# Patient Record
Sex: Female | Born: 1975
Health system: Southern US, Community
[De-identification: ages and names within clinical notes are randomized; demographics above are authoritative.]

## PROBLEM LIST (undated history)

## (undated) DIAGNOSIS — Z9889 Other specified postprocedural states: Secondary | ICD-10-CM

## (undated) DIAGNOSIS — Z8489 Family history of other specified conditions: Secondary | ICD-10-CM

## (undated) DIAGNOSIS — R112 Nausea with vomiting, unspecified: Secondary | ICD-10-CM

## (undated) DIAGNOSIS — F419 Anxiety disorder, unspecified: Secondary | ICD-10-CM

## (undated) HISTORY — DX: Anxiety disorder, unspecified: F41.9

## (undated) HISTORY — PX: TUBAL LIGATION: SHX77

---

## 1998-02-24 ENCOUNTER — Other Ambulatory Visit: Admission: RE | Admit: 1998-02-24 | Discharge: 1998-02-24 | Payer: Self-pay | Admitting: Family Medicine

## 1999-05-04 ENCOUNTER — Other Ambulatory Visit: Admission: RE | Admit: 1999-05-04 | Discharge: 1999-05-04 | Payer: Self-pay | Admitting: Gynecology

## 1999-10-23 ENCOUNTER — Inpatient Hospital Stay (HOSPITAL_COMMUNITY): Admission: AD | Admit: 1999-10-23 | Discharge: 1999-10-23 | Payer: Self-pay | Admitting: *Deleted

## 1999-11-17 ENCOUNTER — Inpatient Hospital Stay (HOSPITAL_COMMUNITY): Admission: AD | Admit: 1999-11-17 | Discharge: 1999-11-17 | Payer: Self-pay | Admitting: Gynecology

## 1999-11-22 ENCOUNTER — Inpatient Hospital Stay (HOSPITAL_COMMUNITY): Admission: AD | Admit: 1999-11-22 | Discharge: 1999-11-25 | Payer: Self-pay | Admitting: Gynecology

## 2000-01-04 ENCOUNTER — Other Ambulatory Visit: Admission: RE | Admit: 2000-01-04 | Discharge: 2000-01-04 | Payer: Self-pay | Admitting: Gynecology

## 2001-03-11 ENCOUNTER — Other Ambulatory Visit: Admission: RE | Admit: 2001-03-11 | Discharge: 2001-03-11 | Payer: Self-pay | Admitting: Obstetrics and Gynecology

## 2002-05-05 ENCOUNTER — Other Ambulatory Visit: Admission: RE | Admit: 2002-05-05 | Discharge: 2002-05-05 | Payer: Self-pay | Admitting: Obstetrics and Gynecology

## 2003-06-08 ENCOUNTER — Other Ambulatory Visit: Admission: RE | Admit: 2003-06-08 | Discharge: 2003-06-08 | Payer: Self-pay | Admitting: Obstetrics and Gynecology

## 2003-11-01 ENCOUNTER — Encounter: Admission: RE | Admit: 2003-11-01 | Discharge: 2003-11-01 | Payer: Self-pay | Admitting: Obstetrics and Gynecology

## 2003-12-16 ENCOUNTER — Inpatient Hospital Stay (HOSPITAL_COMMUNITY): Admission: RE | Admit: 2003-12-16 | Discharge: 2003-12-18 | Payer: Self-pay | Admitting: Obstetrics and Gynecology

## 2003-12-16 ENCOUNTER — Encounter (INDEPENDENT_AMBULATORY_CARE_PROVIDER_SITE_OTHER): Payer: Self-pay | Admitting: *Deleted

## 2004-03-23 ENCOUNTER — Emergency Department (HOSPITAL_COMMUNITY): Admission: EM | Admit: 2004-03-23 | Discharge: 2004-03-23 | Payer: Self-pay | Admitting: Family Medicine

## 2004-06-03 ENCOUNTER — Emergency Department (HOSPITAL_COMMUNITY): Admission: AD | Admit: 2004-06-03 | Discharge: 2004-06-03 | Payer: Self-pay | Admitting: Family Medicine

## 2004-08-15 ENCOUNTER — Other Ambulatory Visit: Admission: RE | Admit: 2004-08-15 | Discharge: 2004-08-15 | Payer: Self-pay | Admitting: Obstetrics and Gynecology

## 2005-07-17 ENCOUNTER — Encounter: Admission: RE | Admit: 2005-07-17 | Discharge: 2005-07-17 | Payer: Self-pay | Admitting: Obstetrics and Gynecology

## 2005-08-30 ENCOUNTER — Other Ambulatory Visit: Admission: RE | Admit: 2005-08-30 | Discharge: 2005-08-30 | Payer: Self-pay | Admitting: Obstetrics and Gynecology

## 2005-12-23 ENCOUNTER — Emergency Department (HOSPITAL_COMMUNITY): Admission: EM | Admit: 2005-12-23 | Discharge: 2005-12-23 | Payer: Self-pay | Admitting: Emergency Medicine

## 2008-06-11 ENCOUNTER — Emergency Department (HOSPITAL_COMMUNITY): Admission: EM | Admit: 2008-06-11 | Discharge: 2008-06-11 | Payer: Self-pay | Admitting: Family Medicine

## 2009-04-11 ENCOUNTER — Encounter (INDEPENDENT_AMBULATORY_CARE_PROVIDER_SITE_OTHER): Payer: Self-pay | Admitting: *Deleted

## 2009-07-21 DIAGNOSIS — G43909 Migraine, unspecified, not intractable, without status migrainosus: Secondary | ICD-10-CM | POA: Insufficient documentation

## 2009-07-25 ENCOUNTER — Ambulatory Visit: Payer: Self-pay | Admitting: Gastroenterology

## 2009-07-26 ENCOUNTER — Telehealth: Payer: Self-pay | Admitting: Gastroenterology

## 2009-07-30 ENCOUNTER — Emergency Department (HOSPITAL_COMMUNITY): Admission: EM | Admit: 2009-07-30 | Discharge: 2009-07-30 | Payer: Self-pay | Admitting: Emergency Medicine

## 2009-08-07 ENCOUNTER — Telehealth (INDEPENDENT_AMBULATORY_CARE_PROVIDER_SITE_OTHER): Payer: Self-pay | Admitting: *Deleted

## 2009-12-02 ENCOUNTER — Emergency Department (HOSPITAL_COMMUNITY): Admission: EM | Admit: 2009-12-02 | Discharge: 2009-12-02 | Payer: Self-pay | Admitting: Family Medicine

## 2010-03-20 NOTE — Letter (Signed)
Summary: New Patient letter  [facility]  [address] [place], [place] [address]   Phone: [phone]  Fax: [phone]       04/11/2009 MRN: [id number]  Catherine Adams [phone] [place], [place]  [address]  Dear Ms. Vilma Meckel,  Welcome to the Gastroenterology Division at [facility].    You are scheduled to see Dr. Arlyce Dice on 05-08-09 at 2:00p.m. on the 3rd floor at [facility], [address].  We ask that you try to arrive at our office 15 minutes prior to your appointment time to allow for check-in.  We would like you to complete the enclosed self-administered evaluation form prior to your visit and bring it with you on the day of your appointment.  We will review it with you.  Also, please bring a complete list of all your medications or, if you prefer, bring the medication bottles and we will list them.  Please bring your insurance card so that we may make a copy of it.  If your insurance requires a referral to see a specialist, please bring your referral form from your primary care physician.  Co-payments are due at the time of your visit and may be paid by cash, check or credit card.     Your office visit will consist of a consult with your physician (includes a physical exam), any laboratory testing he/she may order, scheduling of any necessary diagnostic testing (e.g. x-ray, ultrasound, CT-scan), and scheduling of a procedure (e.g. Endoscopy, Colonoscopy) if required.  Please allow enough time on your schedule to allow for any/all of these possibilities.    If you cannot keep your appointment, please call [phone] to cancel or reschedule prior to your appointment date.  This allows [place] the opportunity to schedule an appointment for another patient in need of care.  If you do not cancel or reschedule by 5 p.m. the business day prior to your appointment date, you will be charged a $50.00 late cancellation/no-show fee.    Thank you for  choosing [facility] for your medical needs.  We appreciate the opportunity to care for you.  Please visit [place] at our website  to learn more about our practice.                     Sincerely,                                                             The Gastroenterology Division

## 2010-03-20 NOTE — Progress Notes (Signed)
Summary: TRIAGE  Phone Note Call from Patient Call back at Work Phone [phone]   Caller: Patient Call For: Dr. Arlyce Dice Reason for Call: Talk to Nurse Summary of Call: wants to know if she is about to be or already is discharged from the practice because of her NOS history Initial call taken by: Vallarie Mare,  July 26, 2009 4:53 PM  Follow-up for Phone Call        Pt. was a 'No Show' 05-08-09, 06-19-09 and 07-25-09.  [facility] PLEASE ADVISE  Follow-up by: Laureen Ochs LPN,  July 26, 2128 4:57 PM  Additional Follow-up for Phone Call Additional follow up Details #1::        I will no longer see this pt Additional Follow-up by: Louis Meckel MD,  July 27, 2009 2:20 PM    Additional Follow-up for Phone Call Additional follow up Details #2::    Pt. advised we will no longer schedule her at [facility].  Per Clarnce Flock,  I asked Ursula Beath to document this in our computer so pt. won't get rescheduled.  Follow-up by: Laureen Ochs LPN,  July 28, 8655 3:01 PM

## 2010-03-20 NOTE — Progress Notes (Signed)
  Phone Note Call from Patient   Caller: Patient Details for Reason: Patient is looking for records that she states should have been sent to Dr. Arlyce Dice for review from [facility]. Details of Action Taken: I explained the process for when records are received in Medical Records from an outside facility. I also called and spoke with Laureen Ochs and Zella Ball of which neither have seen records from [facility] on this patient. Summary of Call: I informed the patient that we had not received records on her from [place]. She stated she would call [facility] to verify if records were sent to Dr. Arlyce Dice or not. Initial call taken by: Wilder Glade,  August 07, 2009 1:49 PM

## 2010-07-06 NOTE — Op Note (Signed)
Catherine Adams, Catherine Adams               ACCOUNT NO.:  [id number]   MEDICAL RECORD NO.:  [id number]          PATIENT TYPE:  INP   LOCATION:  9146                          [facility]   PHYSICIAN:  Leighton Roach Meisinger, M.D.DATE OF BIRTH:  [birth year]   DATE OF PROCEDURE:  12/16/2003  DATE OF DISCHARGE:                                 OPERATIVE REPORT   PREOPERATIVE DIAGNOSES:  Intrauterine pregnancy at 38 plus weeks, declined  vaginal delivery with a history of a fourth degree laceration and persistent  rectal dysfunction and desires surgical sterility, Class A1 Gestational  Diabetes.   POSTOPERATIVE DIAGNOSES:  Intrauterine pregnancy at 38 plus weeks, declined  vaginal delivery with a history of a fourth degree laceration and persistent  rectal dysfunction and desires surgical sterility, Class A1 Gestational  Diabetes.   PROCEDURE:  Primary low transverse cesarean section and bilateral partial  salpingectomy.   SURGEON:  Zenaida Niece, M.D.   ASSISTANT:  Malachi Pro. Ambrose Mantle, M.D.   ANESTHESIA:  Spinal.   ESTIMATED BLOOD LOSS:  1300 mL.   FINDINGS:  The patient had a normal anatomy and delivered a viable female  infant with Apgar's of 9 and 9 that weighed 8 pounds 10 ounces.   DESCRIPTION OF PROCEDURE:  The patient was taken to the operating room and  the placed in the sitting position.  Dr. Arby Barrette instilled spinal  anesthesia and she was placed in the dorsal supine position with left  lateral tilt.  Abdomen was prepped and draped in the usual sterile fashion  and a Foley catheter inserted. The level of her anesthesia was found to be  adequate and abdomen was entered via a standard Pfannenstiel incision.  Bladder blade was placed and a 4 cm transverse incision was made in the  lower uterine segment pushing the bladder inferior. Brisk bleeding was noted  from venous plexus in the myometrium.  Once the amniotic cavity was entered,  the incision was extended bilaterally digitally.  The fetal vertex was  grasped and delivered through the incision atraumatically. The mouth and  nares were suctioned. The remainder of the infant delivered atraumatically.  The cord was doubly clamped and cut and the infant handed to the awaiting  pediatric team.  Cord blood was obtained and the placenta was manually  removed due to the brisk bleeding. The uterine incision was inspected and  found to be free of extensions. Bleeding from the edges was controlled with  ring forceps. The uterus was wiped dry with a clean lap pad and firmed up  nicely with Pitocin. The uterine incision was then closed in one layer being  a running locking layer with #1 chromic.  Bleeding from the right angle was  controlled with a figure-of-eight suture of #1 chromic.  Bleeding from  serosal edges was controlled with electrocautery.   Attention was turned to the tubal ligation.  Both fallopian tubes were  identified and traced to their fimbriated ends.  Ovaries were normal.  A  segment of tube on each side was grasped with a Babcock clamp. A window was  made in an  avascular portion of the mesosalpinx with electrocautery.  #0  plain gut suture was passed through this window and tied proximally and then  wrapped around the distal portion of the tube to create a knuckle of tube.  This knuckle of tube was then removed sharply. On both sides, both tubal  ostia were identified and the stumps were hemostatic.  The uterine incision  was again inspected and found to be hemostatic.  The subfascial space was  irrigated and made hemostatic with electrocautery.  The fascia was closed in  a running fashion starting at both ends and meeting in the middle with #0  Vicryl. The subcutaneous tissue was irrigated and made hemostatic with  electrocautery. The skin was then closed with running subcuticular suture of  4-0 Prolene followed by Steri-Strips and a sterile bandage. The patient  tolerated the procedure well and was taken  to the recovery room in stable  condition. Counts were correct and she received 1 g after cord clamp.     Todd   TDM/MEDQ  D:  12/16/2003  T:  12/16/2003  Job:  [id number]
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
## 2010-07-06 NOTE — Discharge Summary (Signed)
Catherine Adams, HUESMAN               ACCOUNT NO.:  [id number]   MEDICAL RECORD NO.:  [id number]          PATIENT TYPE:  INP   LOCATION:  9146                          [facility]   PHYSICIAN:  Zenaida Niece, M.D.DATE OF BIRTH:  Jan 20, 1976   DATE OF ADMISSION:  12/16/2003  DATE OF DISCHARGE:  12/18/2003                                 DISCHARGE SUMMARY   ADMISSION DIAGNOSES:  1.  Intrauterine pregnancy at 38 plus weeks.  2.  Class A1 gestational diabetes.  3.  Declines vaginal delivery due to a history of fourth-degree tear and      persistent rectal sphincter dysfunction and desires surgical sterility.   DISCHARGE DIAGNOSES:  1.  Intrauterine pregnancy at 38 plus weeks.  2.  Class A1 gestational diabetes.  3.  Declines vaginal delivery due to a history of fourth-degree tear and      persistent rectal sphincter dysfunction and desires surgical sterility.   PROCEDURE:  On October 28, she had a primary low transverse cesarean section  and bilateral partial salpingectomy.   HISTORY AND PHYSICAL:  Please see chart for full history and physical, but  briefly this is a 35 year old white female, gravida 2, para 1-0-0-1, with an  EGA of 38 plus weeks, who is admitted for elective primary cesarean section  due to a history of a fourth-degree tear with persistent rectal sphincter  dysfunction, and she declines vaginal delivery.  Prenatal care complicated  by class A1 gestational diabetes.   PHYSICAL EXAMINATION:  Significant for a gravid abdomen with fundal height  of 39 cm, and cervix was 1, thick, and high with a vertex presentation.   HOSPITAL COURSE:  The patient was admitted on the day of surgery and  underwent primary cesarean section and tubal ligation under spinal  anesthesia with an estimated blood loss of 1300 mL.  The patient had normal  anatomy and delivered a viable female infant with Apgars of 9 and 9 and  weighed 8 pounds 10 ounces.  Postoperatively, she had no  significant  complications.  Predelivery hemoglobin 12.6, postdelivery 10.2.  On  postoperative day #2, the patient requested discharge home.  Her incision  was healing well with an intact Prolene subcuticular suture and Steri-  Strips.   DISCHARGE INSTRUCTIONS:  Regular diet, pelvic rest, no strenuous activity.  Follow up visit in 2 days for suture removal.   MEDICATIONS:  1.  Darvocet-N 100 #40, 1-2 p.o. q.4-6h. p.r.n. pain.  2.  Over-the-counter ibuprofen p.r.n.   She is given our discharge pamphlet.     Todd   TDM/MEDQ  D:  12/18/2003  T:  12/18/2003  Job:  [id number]

## 2010-07-06 NOTE — Discharge Summary (Signed)
[facility]  Patient:    Catherine Adams, Catherine Adams                      MRN: [id number] Adm. Date:  [id number] Disc. Date: [id number] Attending:  Tonye Royalty Dictator:   Antony Contras, [facility]                           Discharge Summary  DISCHARGE DIAGNOSES:          1. Intrauterine pregnancy at 39+ weeks.                               2. Spontaneous onset of labor.  PROCEDURES:                   Vacuum assisted vaginal delivery of viable infant over midline episiotomy with fourth degree extension.  HISTORY OF PRESENT ILLNESS:   The patient is a 35 year old primigravida with an LMP of February 18, 1999, [facility] November 25, 1999.  Prenatal course was uncomplicated.  PRENATAL LABS:                Blood type O positive.  Antibody screen negative.  RPR, HBSAG, HIV nonreactive. Rubella immune.  Toxoplasmosis IGG high level.  MSAFP normal.  GBS is negative.  HOSPITAL COURSE/TREATMENT:    The patient was admitted on November 22, 1999, with spontaneous onset of labor.  Cervix was 3 cm, 90%, vertex at a -1 station.  She did progress to complete dilatation and was delivered of an Apgar 8/9 female infant over a midline episiotomy with a fourth degree extension.  The weight was 8 pounds 6 ounces.  Postpartum course was uncomplicated.  She remained afebrile.  She had no difficulty voiding and was able to be discharged on her second postpartum day in satisfactory condition.  Postpartum CBC was not in the chart.  DISPOSITION:                  Follow-up in six weeks.  Continue with prenatal vitamins and iron.  Motrin and Tylox for pain.  Fourth degree precautions discussed. DD:  12/14/99 TD:  12/14/99 Job: [id number]

## 2012-06-26 ENCOUNTER — Telehealth: Payer: Self-pay | Admitting: Physician Assistant

## 2012-06-30 ENCOUNTER — Telehealth: Payer: Self-pay | Admitting: Physician Assistant

## 2012-06-30 MED ORDER — ALPRAZOLAM 0.25 MG PO TABS: 0.2500 mg | ORAL_TABLET | Freq: Every evening | ORAL | Status: DC | PRN

## 2012-06-30 NOTE — Telephone Encounter (Signed)
Alprazolam 0.25mg  take one tablet every day as needed #30 last refill 12/06/2011

## 2012-06-30 NOTE — Telephone Encounter (Signed)
Pt has not been seen since 04/2011.  Pt was called and schedule CPE for 07/10/12.  One month refill called for alprazolam.

## 2012-06-30 NOTE — Telephone Encounter (Signed)
Agree. Approved. 

## 2012-07-09 NOTE — Telephone Encounter (Signed)
Done 06/30/12

## 2012-07-10 ENCOUNTER — Encounter: Payer: Self-pay | Admitting: Physician Assistant

## 2012-07-10 ENCOUNTER — Ambulatory Visit (INDEPENDENT_AMBULATORY_CARE_PROVIDER_SITE_OTHER): Payer: 59 | Admitting: Physician Assistant

## 2012-07-10 VITALS — BP 110/78 | HR 76 | Temp 97.7°F | Resp 18 | Ht 61.5 in | Wt 169.0 lb

## 2012-07-10 DIAGNOSIS — M545 Low back pain, unspecified: Secondary | ICD-10-CM

## 2012-07-10 DIAGNOSIS — F419 Anxiety disorder, unspecified: Secondary | ICD-10-CM

## 2012-07-10 DIAGNOSIS — Z0000 Encounter for general adult medical examination without abnormal findings: Secondary | ICD-10-CM

## 2012-07-10 DIAGNOSIS — F411 Generalized anxiety disorder: Secondary | ICD-10-CM | POA: Insufficient documentation

## 2012-07-10 MED ORDER — METAXALONE 800 MG PO TABS: 800.0000 mg | ORAL_TABLET | Freq: Four times a day (QID) | ORAL | Status: DC

## 2012-07-10 MED ORDER — TRAMADOL HCL 50 MG PO TABS: 50.0000 mg | ORAL_TABLET | Freq: Four times a day (QID) | ORAL | Status: DC | PRN

## 2012-07-10 NOTE — Progress Notes (Signed)
Patient ID: ZELPHA MESSING MRN: [id number], DOB: 1975-09-21, 37 y.o. Date of Encounter: @DATE @  Chief Complaint:  Chief Complaint  Patient presents with  . Annual Exam  . Back Pain    X monday    HPI: 37 y.o. year old female  presents for CPE. She sees Dr. Senaida Ores for Gyn exams so does not need breast/pelvic exam today.  She is now working as a Research scientist (medical). States that 5 days ago she lifted a dog. Felt no pain then but later that day felt achy pain in right lower back. Was feeling some pain down into thigh but that has resolved. Has taken ibuprofen with no relief. Has no h/o back pain or back problems.  No other complaints.  Does not need Xanax every day, only occasionally.    Past Medical History  Diagnosis Date  . Anxiety      Home Meds: See attached medication section for current medication list. Any medications entered into computer today will not appear on this note's list. The medications listed below were entered prior to today. Current Outpatient Prescriptions on File Prior to Visit  Medication Sig Dispense Refill  . ALPRAZolam (XANAX) 0.25 MG tablet Take 1 tablet (0.25 mg total) by mouth at bedtime as needed for sleep.  30 tablet  0   No current facility-administered medications on file prior to visit.    Allergies:  Allergies  Allergen Reactions  . Codeine     REACTION: N\T\V  . Monistat (Miconazole Nitrate-Wipes)     History   Social History  . Marital Status: Married    Spouse Name: N/A    Number of Children: N/A  . Years of Education: N/A   Occupational History  . Not on file.   Social History Main Topics  . Smoking status: Former Games developer  . Smokeless tobacco: Not on file  . Alcohol Use: Not on file  . Drug Use: Not on file  . Sexually Active: Yes    Birth Control/ Protection: Surgical   Other Topics Concern  . Not on file   Social History Narrative  . No narrative on file    Family History  Problem Relation Age of Onset  . Heart  disease Father 77    CAD     Review of Systems:  See HPI for pertinent ROS. All other ROS negative.    Physical Exam: Blood pressure 110/78, pulse 76, temperature 97.7 F (36.5 C), temperature source Oral, resp. rate 18, height 5' 1.5" (1.562 m), weight 169 lb (76.658 kg), last menstrual period 06/10/2012., Body mass index is 31.42 kg/(m^2). General: WNWD WF. Appears in no acute distress. Head: Normocephalic, atraumatic, eyes without discharge, sclera non-icteric, nares are without discharge. Bilateral auditory canals clear, TM's are without perforation, pearly grey and translucent with reflective cone of light bilaterally. Oral cavity moist, posterior pharynx without exudate, erythema, peritonsillar abscess, or post nasal drip.  Neck: Supple. No thyromegaly. No lymphadenopathy. Lungs: Clear bilaterally to auscultation without wheezes, rales, or rhonchi. Breathing is unlabored. Heart: RRR with S1 S2. No murmurs, rubs, or gallops. Abdomen: Soft, non-tender, non-distended with normoactive bowel sounds. No hepatomegaly. No rebound/guarding. No obvious abdominal masses. Musculoskeletal:  Strength and tone normal for age. Extremities/Skin: Warm and dry. No clubbing or cyanosis. No edema. No rashes or suspicious lesions. Neuro: Alert and oriented X 3. Moves all extremities spontaneously. Gait is normal. CNII-XII grossly in tact. Psych:  Responds to questions appropriately with a normal affect. Back: Minimal pain with palpation.  Points to L5 level from midline and to the right.  Left SLR: Causes some pain in right low back. Right SLR: nml-causes no significant pain. Bilateral hip abduction: normal-causes no significant low back pain.  Patella DTRs 2+, equal bilaterally.       ASSESSMENT AND PLAN:  37 y.o. year old female with  1. Visit for preventive health examination She is not fasting. She will RTC fasting to check screening labs.  - CBC with Differential; Future - COMPLETE METABOLIC PANEL  WITH GFR; Future - Lipid panel; Future - TSH; Future - Vitamin D 25 hydroxy; Future  Immunizations: Tetanus given here 10/2009  She sees Dr. Senaida Ores for Gyn exam and care.   2. Low back pain Secondary to muscle strain. Rest the back. Use heat. Stretch. Can use OTC Nsaid prn with food. Cautioned that Skelaxin may cause drowsiness. Use tramadol sparingly for significant pain. - metaxalone (SKELAXIN) 800 MG tablet; Take 1 tablet (800 mg total) by mouth 4 (four) times daily.  Dispense: 60 tablet; Refill: 0 - traMADol (ULTRAM) 50 MG tablet; Take 1 tablet (50 mg total) by mouth every 6 (six) hours as needed for pain.  Dispense: 60 tablet; Refill: 0  3. Anxiety Cont Xanax prn.    Murray Hodgkins [place], [place], [facility] 07/10/2012 2:47 PM

## 2012-07-23 ENCOUNTER — Other Ambulatory Visit (INDEPENDENT_AMBULATORY_CARE_PROVIDER_SITE_OTHER): Payer: 59

## 2012-07-23 DIAGNOSIS — Z0000 Encounter for general adult medical examination without abnormal findings: Secondary | ICD-10-CM

## 2012-07-23 LAB — LIPID PANEL
Cholesterol: 187 mg/dL (ref 0–200)
HDL: 60 mg/dL (ref >39)

## 2012-07-23 LAB — CBC WITH DIFFERENTIAL/PLATELET
Basophils Relative: 0 % (ref 0–1)
Eosinophils Absolute: 0.1 10*3/uL (ref 0.0–0.7)
Eosinophils Relative: 2 % (ref 0–5)
HCT: 40.4 % (ref 36.0–46.0)
Lymphocytes Relative: 29 % (ref 12–46)
MCH: 29.9 pg (ref 26.0–34.0)
MCHC: 33.9 g/dL (ref 30.0–36.0)
MCV: 88.2 fL (ref 78.0–100.0)
Monocytes Absolute: 0.5 10*3/uL (ref 0.1–1.0)
RDW: 12.9 % (ref 11.5–15.5)
WBC: 5.6 10*3/uL (ref 4.0–10.5)

## 2012-07-23 LAB — COMPLETE METABOLIC PANEL WITH GFR
Alkaline Phosphatase: 59 U/L (ref 39–117)
Glucose, Bld: 95 mg/dL (ref 70–99)
Total Bilirubin: 0.5 mg/dL (ref 0.3–1.2)
Total Protein: 6.7 g/dL (ref 6.0–8.3)

## 2012-07-23 LAB — TSH: TSH: 1.962 u[IU]/mL (ref 0.350–4.500)

## 2012-07-24 LAB — VITAMIN D 25 HYDROXY (VIT D DEFICIENCY, FRACTURES): Vit D, 25-Hydroxy: 58 ng/mL (ref 30–89)

## 2012-07-28 ENCOUNTER — Ambulatory Visit (INDEPENDENT_AMBULATORY_CARE_PROVIDER_SITE_OTHER): Payer: 59 | Admitting: Family Medicine

## 2012-07-28 VITALS — BP 120/70 | HR 80 | Temp 96.4°F | Resp 18 | Ht 62.0 in | Wt 168.0 lb

## 2012-07-28 DIAGNOSIS — R109 Unspecified abdominal pain: Secondary | ICD-10-CM

## 2012-07-28 LAB — COMPREHENSIVE METABOLIC PANEL
ALT: 12 U/L (ref 0–35)
BUN: 8 mg/dL (ref 6–23)
CO2: 25 mEq/L (ref 19–32)
Chloride: 102 mEq/L (ref 96–112)
Creat: 0.68 mg/dL (ref 0.50–1.10)
Total Protein: 7.1 g/dL (ref 6.0–8.3)

## 2012-07-28 LAB — URINALYSIS, ROUTINE W REFLEX MICROSCOPIC
Leukocytes, UA: NEGATIVE
Nitrite: NEGATIVE
Protein, ur: NEGATIVE mg/dL
Specific Gravity, Urine: 1.015 (ref 1.005–1.030)

## 2012-07-28 LAB — CBC WITH DIFFERENTIAL/PLATELET
Basophils Relative: 0 % (ref 0–1)
HCT: 38.7 % (ref 36.0–46.0)
MCH: 30.3 pg (ref 26.0–34.0)
MCHC: 34.9 g/dL (ref 30.0–36.0)
MCV: 87 fL (ref 78.0–100.0)
Neutro Abs: 6.7 10*3/uL (ref 1.7–7.7)
Platelets: 228 10*3/uL (ref 150–400)
RDW: 12.5 % (ref 11.5–15.5)

## 2012-07-28 LAB — URINALYSIS, MICROSCOPIC ONLY
Casts: NONE SEEN
Crystals: NONE SEEN

## 2012-07-28 LAB — LIPASE: Lipase: <10 U/L (ref 0–75)

## 2012-07-28 MED ORDER — OMEPRAZOLE 20 MG PO CPDR: 20.0000 mg | DELAYED_RELEASE_CAPSULE | Freq: Every day | ORAL | Status: DC

## 2012-07-28 NOTE — Patient Instructions (Signed)
Start the prilosec once a day - take 30 minutes before  Miralax is over the counter- scoop ful once a day  F/U as needed

## 2012-07-29 ENCOUNTER — Ambulatory Visit
Admission: RE | Admit: 2012-07-29 | Discharge: 2012-07-29 | Disposition: A | Discharge status: Home / Self Care | Payer: 59 | Source: Ambulatory Visit | Attending: Family Medicine | Admitting: Family Medicine

## 2012-07-29 ENCOUNTER — Telehealth: Payer: Self-pay | Admitting: Physician Assistant

## 2012-07-29 ENCOUNTER — Encounter: Payer: Self-pay | Admitting: Family Medicine

## 2012-07-29 DIAGNOSIS — R109 Unspecified abdominal pain: Secondary | ICD-10-CM

## 2012-07-29 MED ORDER — IOHEXOL 300 MG/ML  SOLN
100.0000 mL | Freq: Once | INTRAMUSCULAR | Status: AC | PRN
  Administered 2012-07-29: 100 mL via INTRAVENOUS

## 2012-07-29 NOTE — Progress Notes (Signed)
  Subjective:    Patient ID: Catherine Adams, female    DOB: [birth year], 37 y.o.   MRN: [id number]  HPI  Patient presents with abdominal pain for the past 3 days. Pain is in her upper and lower stomach but mostly around the umbilicus. She had nausea with vomiting x1 on Saturday and one episode of diarrhea. She's a history of irritable bowel syndrome. She did try to move her bowels with minin mal improvement But did not have much benefit from the pain. She does have a history of acid reflux as well which causes of epigastric pain. She denies any vaginal bleeding or discharge. She's had a tubal ligation. He denies any fever. She states the pain gets worse if she leans on her abdomen. Pain does not change with intake   Review of Systems - per above  GEN- denies fatigue, fever, weight loss,weakness, recent illness HEENT- denies eye drainage, change in vision, nasal discharge, CVS- denies chest pain, palpitations RESP- denies SOB, cough, wheeze ABD- denies N/V, +change in stools, +abd pain GU- denies dysuria, hematuria, dribbling, incontinence Neuro- denies headache, dizziness, syncope, seizure activity      Objective:   Physical Exam    GEN- NAD, alert and oriented x3 HEENT- PERRL, EOMI, non injected sclera, pink conjunctiva, MMM, oropharynx clear Neck- Supple, no LAD CVS- RRR, no murmur RESP-CTAB ABD-NABS,soft, mild TTP Epigastric, near umbilicus, lower quadrants, no rebound, no gaurding, no mass felt, neg murphys sign EXT- No edema Pulses- Radial 2+       Assessment & Plan:

## 2012-07-29 NOTE — Telephone Encounter (Signed)
CT scan has been ordered.  

## 2012-07-29 NOTE — Assessment & Plan Note (Addendum)
Worsening abdominal pain. Is mostly in the epigastric and umbilical area. She does not have any current nausea vomiting or diarrhea. She does have Irrittable bowel syndrome.  Stat labs were drawn which were all normal. Lipase is normal see met CBC all normal. She was given Prilosec to see if this helps epigastric pain and also told to use MiraLAX for constipation. If this is not improved will send her for CT of abdomen pelvis  UA neg- trace blood, due to start menses this week

## 2012-08-01 ENCOUNTER — Other Ambulatory Visit: Payer: Self-pay | Admitting: Family Medicine

## 2012-08-01 DIAGNOSIS — N83299 Other ovarian cyst, unspecified side: Secondary | ICD-10-CM

## 2012-08-05 ENCOUNTER — Other Ambulatory Visit: Payer: 59

## 2012-08-06 ENCOUNTER — Telehealth: Payer: Self-pay | Admitting: Physician Assistant

## 2012-08-06 NOTE — Telephone Encounter (Signed)
Need approval for controlled medication. 

## 2012-08-06 NOTE — Telephone Encounter (Signed)
Take 1 tablet by mouth at bedtime as needed last refill 06/30/2012 # 30

## 2012-08-07 MED ORDER — ALPRAZOLAM 0.25 MG PO TABS: 0.2500 mg | ORAL_TABLET | Freq: Every evening | ORAL | Status: DC | PRN

## 2012-08-07 NOTE — Telephone Encounter (Signed)
Refills called in 

## 2012-08-07 NOTE — Telephone Encounter (Signed)
Approved.  # 30 +2 additional refills. 

## 2012-09-21 ENCOUNTER — Ambulatory Visit
Admission: RE | Admit: 2012-09-21 | Discharge: 2012-09-21 | Disposition: A | Discharge status: Home / Self Care | Payer: 59 | Source: Ambulatory Visit | Attending: Family Medicine | Admitting: Family Medicine

## 2012-09-21 DIAGNOSIS — N83299 Other ovarian cyst, unspecified side: Secondary | ICD-10-CM

## 2013-03-03 ENCOUNTER — Other Ambulatory Visit: Payer: Self-pay | Admitting: Physician Assistant

## 2013-03-04 NOTE — Telephone Encounter (Signed)
RX called in .

## 2013-03-04 NOTE — Telephone Encounter (Signed)
Approved for #30+2 additional refills 

## 2013-03-04 NOTE — Telephone Encounter (Signed)
Last Rf 6/20 #30 + 2  Last OV CPE 5/23  OK refill?

## 2013-03-31 ENCOUNTER — Ambulatory Visit (INDEPENDENT_AMBULATORY_CARE_PROVIDER_SITE_OTHER): Payer: 59 | Admitting: Physician Assistant

## 2013-03-31 ENCOUNTER — Encounter: Payer: Self-pay | Admitting: Physician Assistant

## 2013-03-31 VITALS — BP 112/68 | HR 100 | Temp 98.4°F | Resp 18 | Ht 62.25 in | Wt 170.0 lb

## 2013-03-31 DIAGNOSIS — R509 Fever, unspecified: Secondary | ICD-10-CM

## 2013-03-31 DIAGNOSIS — B9689 Other specified bacterial agents as the cause of diseases classified elsewhere: Secondary | ICD-10-CM

## 2013-03-31 DIAGNOSIS — A499 Bacterial infection, unspecified: Secondary | ICD-10-CM

## 2013-03-31 DIAGNOSIS — J988 Other specified respiratory disorders: Secondary | ICD-10-CM

## 2013-03-31 LAB — INFLUENZA A AND B
INFLUENZA A AG: NEGATIVE
Influenza B Ag: NEGATIVE

## 2013-03-31 MED ORDER — FLUCONAZOLE 150 MG PO TABS: 150.0000 mg | ORAL_TABLET | Freq: Once | ORAL | Status: DC

## 2013-03-31 MED ORDER — AZITHROMYCIN 250 MG PO TABS: ORAL_TABLET | ORAL | Status: DC

## 2013-03-31 MED ORDER — BENZONATATE 200 MG PO CAPS: 200.0000 mg | ORAL_CAPSULE | Freq: Two times a day (BID) | ORAL | Status: DC | PRN

## 2013-03-31 NOTE — Progress Notes (Signed)
Patient ID: Catherine CordsBrandy S Adams MRN: [id number], DOB: 09/17/1975, 38 y.o. Date of Encounter: 03/31/2013, 10:36 AM    Chief Complaint:  Chief Complaint  Patient presents with  . x 2 days congestion, fever, chills, body aches    green secretions     HPI: 38 y.o. year old female reports that she has had a lot of phlegm in her throat. Also has had a lot of chills and bodyaches. A lot of pressure and congestion feeling throughout her head. Developing a deep cough. States that her mother-in-law and her son have had the same type symptoms. However both of them have continued to have a cough that has persisted for greater than one week. Her mother-in-law went to her doctor yesterday and was prescribed antibiotic.     Home Meds: See attached medication section for any medications that were entered at today's visit. The computer does not put those onto this list.The following list is a list of meds entered prior to today's visit.   Current Outpatient Prescriptions on File Prior to Visit  Medication Sig Dispense Refill  . ALPRAZolam (XANAX) 0.25 MG tablet TAKE 1 TABLET BY MOUTH AT BEDTIME AS NEEDED  30 tablet  2   No current facility-administered medications on file prior to visit.    Allergies:  Allergies  Allergen Reactions  . Codeine     REACTION: N\T\V  . Monistat [Miconazole Nitrate-Wipes]       Review of Systems: See HPI for pertinent ROS. All other ROS negative.    Physical Exam: Blood pressure 112/68, pulse 100, temperature 98.4 F (36.9 C), temperature source Oral, resp. rate 18, height 5' 2.25" (1.581 m), weight 170 lb (77.111 kg), last menstrual period 03/26/2013., Body mass index is 30.85 kg/(m^2). General: WNWD WF. Appears in no acute distress. HEENT: Normocephalic, atraumatic, eyes without discharge, sclera non-icteric, nares are without discharge. Bilateral auditory canals clear, TM's are without perforation, pearly grey and translucent with reflective cone of light  bilaterally. Oral cavity moist, posterior pharynx without exudate, erythema, peritonsillar abscess. No tenderness with percussion of frontal and maxillary sinuses.  Neck: Supple. No thyromegaly. No lymphadenopathy. Lungs: Clear bilaterally to auscultation without wheezes, rales, or rhonchi. Breathing is unlabored. Heart: Regular rhythm. No murmurs, rubs, or gallops. Msk:  Strength and tone normal for age. Extremities/Skin: Warm and dry.  Neuro: Alert and oriented X 3. Moves all extremities spontaneously. Gait is normal. CNII-XII grossly in tact. Psych:  Responds to questions appropriately with a normal affect.   Results for orders placed in visit on 03/31/13  INFLUENZA A AND B      Result Value Ref Range   Source-INFBD NASAL     Inflenza A Ag NEG  Negative   Influenza B Ag NEG  Negative     ASSESSMENT AND PLAN:  38 y.o. year old female with  1. Bacterial respiratory infection Patient is requesting a medication that she continues to suppress cough. Also states that she has gotten yeast infections with antibiotics in the past and is requesting medication and she can use for this. I told her that this would be a one time dose and to take it at the end of the antibiotic. Followup if symptoms worsen or do not resolve within one week after completion of antibiotic. - benzonatate (TESSALON) 200 MG capsule; Take 1 capsule (200 mg total) by mouth 2 (two) times daily as needed for cough.  Dispense: 20 capsule; Refill: 0 - azithromycin (ZITHROMAX) 250 MG tablet; Day 1: Take  2 daily. Days 2-5: Take 1 daily.  Dispense: 6 tablet; Refill: 0 - fluconazole (DIFLUCAN) 150 MG tablet; Take 1 tablet (150 mg total) by mouth once.  Dispense: 1 tablet; Refill: 0  2. Fever, unspecified - Influenza a and b   Signed, [address] [place], [place], [facility] 03/31/2013 10:36 AM

## 2013-04-02 ENCOUNTER — Encounter: Payer: Self-pay | Admitting: Family Medicine

## 2013-04-02 ENCOUNTER — Telehealth: Payer: Self-pay | Admitting: Physician Assistant

## 2013-04-02 NOTE — Telephone Encounter (Signed)
Called patient.  Was just seen two days ago.  Congestion is starting to break up.  I told her this can worsen cough temporarily.  Really can not call in anything stronger then Tessalon due to Codeine allergy.  Take Tessalon BID thru the weekend.  Push fluids.  If still with bad cough on Monday call us back.  Was given a Diflucan to use at end of antibiotic.  Pt states has already taken because started to itch.  If still needs one after finishes antibiotic, again, call us back.

## 2013-04-02 NOTE — Telephone Encounter (Signed)
PT Cough is not getting any better and she is wanting something else  Pharmacy is [facility] Call back number is [phone]

## 2013-08-04 ENCOUNTER — Other Ambulatory Visit: Payer: Self-pay | Admitting: Physician Assistant

## 2013-08-04 NOTE — Telephone Encounter (Signed)
Ok to refill??  Last office visit 03/31/2013.  Last refill 03/03/2013.

## 2013-08-05 NOTE — Telephone Encounter (Signed)
Medication called to pharmacy. 

## 2013-08-05 NOTE — Telephone Encounter (Signed)
Approved for #30+2 additional refills 

## 2014-03-10 ENCOUNTER — Encounter: Payer: Self-pay | Admitting: Physician Assistant

## 2014-03-10 ENCOUNTER — Ambulatory Visit (INDEPENDENT_AMBULATORY_CARE_PROVIDER_SITE_OTHER): Payer: 59 | Admitting: Physician Assistant

## 2014-03-10 VITALS — BP 108/70 | HR 76 | Temp 98.2°F | Resp 18 | Ht 62.25 in | Wt 167.0 lb

## 2014-03-10 DIAGNOSIS — M542 Cervicalgia: Secondary | ICD-10-CM

## 2014-03-10 MED ORDER — ALPRAZOLAM 0.25 MG PO TABS: 0.2500 mg | ORAL_TABLET | Freq: Every evening | ORAL | Status: DC | PRN

## 2014-03-10 MED ORDER — CYCLOBENZAPRINE HCL 10 MG PO TABS: 10.0000 mg | ORAL_TABLET | Freq: Three times a day (TID) | ORAL | Status: DC | PRN

## 2014-03-10 NOTE — Progress Notes (Signed)
Patient ID: MEEYA GOLDIN MRN: [id number], DOB: Sep 24, 1975, 39 y.o. Date of Encounter: 03/10/2014, 2:05 PM    Chief Complaint:  Chief Complaint  Patient presents with  . neck pain x 2 weeks    trouble turning head     HPI: 39 y.o. 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 Other than that, she cannot think of anything else she has done would be causing this neck pain. Has done no exercise or other upper body activity lifting etc. Says she has been feeling this discomfort in her right neck for 2-1/2 weeks. Cannot turn her head to the right and can not tilt her head to the right. Can turn to the left and can tilts to the left without any problem. Says that she has no prior history of this type of discomfort. Has no pain numbness or tingling down either arm.    Home Meds:   Outpatient Prescriptions Prior to Visit  Medication Sig Dispense Refill  . omeprazole (PRILOSEC) 20 MG capsule Take 20 mg by mouth daily as needed.    . ALPRAZolam (XANAX) 0.25 MG tablet TAKE 1 TABLET BY MOUTH AT BEDTIME AS NEEDED 30 tablet 2  . fluconazole (DIFLUCAN) 150 MG tablet Take 1 tablet (150 mg total) by mouth once. (Patient not taking: Reported on 03/10/2014) 1 tablet 0  . azithromycin (ZITHROMAX) 250 MG tablet Day 1: Take 2 daily. Days 2-5: Take 1 daily. 6 tablet 0  . benzonatate (TESSALON) 200 MG capsule Take 1 capsule (200 mg total) by mouth 2 (two) times daily as needed for cough. 20 capsule 0   No facility-administered medications prior to visit.    Allergies:  Allergies  Allergen Reactions  . Codeine     REACTION: N\T\V  . Monistat [Miconazole Nitrate-Wipes]       Review of Systems: See HPI for pertinent ROS. All other ROS negative.    Physical Exam: Blood pressure 108/70, pulse 76, temperature 98.2 F (36.8 C), temperature source Oral, resp. rate 18, height 5' 2.25" (1.581 m), weight 167 lb (75.751 kg)., Body mass  index is 30.31 kg/(m^2). General: WNWD WF.  Appears in no acute distress. Neck: Supple. No thyromegaly. No lymphadenopathy. Can only turn head to the right approx 30 degrees. Can only tilts her head to the right approximately 30. Can turn her head to the left approximately 90. Can tilts her head to the left so that her ear is to her shoulder. Palpation of the right side of the neck only elicits moderate amount of pain and discomfort. She states that it feels like it's a deeper tightness. Lungs: Clear bilaterally to auscultation without wheezes, rales, or rhonchi. Breathing is unlabored. Heart: Regular rhythm. No murmurs, rubs, or gallops. Msk:  Strength and tone normal for age. Extremities/Skin: Warm and dry.  Neuro: Alert and oriented X 3. Moves all extremities spontaneously. Gait is normal. CNII-XII grossly in tact. Psych:  Responds to questions appropriately with a normal affect.     ASSESSMENT AND PLAN:  39 y.o. year old female with  1. Neck pain Cautioned that muscle relaxer makes cause drowsiness. It does cause drowsiness than just take it at bedtime. Also told her to apply heat to the area. Can use a heating pad and also use hot water from the shower to the area. Recommended that she stretch the area involving range of motion of the neck and of the shoulders.  If symptoms are not improved over the next few days then recommend follow-up with chiropractor. F/U here  if symptoms do not resolve. - cyclobenzaprine (FLEXERIL) 10 MG tablet; Take 1 tablet (10 mg total) by mouth 3 (three) times daily as needed for muscle spasms.  Dispense: 60 tablet; Refill: 0   Signed, [address], [place], [facility] 03/10/2014 2:05 PM

## 2014-03-27 ENCOUNTER — Other Ambulatory Visit: Payer: Self-pay | Admitting: Physician Assistant

## 2014-03-28 NOTE — Telephone Encounter (Signed)
Last RF 03/10/14  #60  LOV 03/10/14  OK refill??

## 2014-03-28 NOTE — Telephone Encounter (Signed)
rx called in

## 2014-03-28 NOTE — Telephone Encounter (Signed)
May  fill for #90+1 additional refill.

## 2014-05-23 ENCOUNTER — Telehealth: Payer: Self-pay | Admitting: Physician Assistant

## 2014-05-23 NOTE — Telephone Encounter (Signed)
Pt states has seen some rectal bleeding over the week end.  Not heavy, no abd discomfort.  Has also had some diarrhea.  States she and spouse had eaten some raw fish at [employer], maybe cause?  Told needs to be seen.  appt given for tomorrow.

## 2014-05-23 NOTE — Telephone Encounter (Signed)
[phone]  Patient calling to ask some questions about blood being in her stool

## 2014-05-24 ENCOUNTER — Ambulatory Visit (INDEPENDENT_AMBULATORY_CARE_PROVIDER_SITE_OTHER): Payer: 59 | Admitting: Family Medicine

## 2014-05-24 ENCOUNTER — Encounter: Payer: Self-pay | Admitting: Family Medicine

## 2014-05-24 VITALS — BP 124/68 | HR 78 | Temp 98.6°F | Resp 16 | Ht 63.0 in | Wt 170.0 lb

## 2014-05-24 DIAGNOSIS — K529 Noninfective gastroenteritis and colitis, unspecified: Secondary | ICD-10-CM | POA: Diagnosis not present

## 2014-05-24 LAB — CBC W/MCH & 3 PART DIFF
HCT: 39.2 % (ref 36.0–46.0)
HEMOGLOBIN: 13.5 g/dL (ref 12.0–15.0)
LYMPHS ABS: 1.9 10*3/uL (ref 0.7–4.0)
Lymphocytes Relative: 29 % (ref 12–46)
MCH: 30.8 pg (ref 26.0–34.0)
MCHC: 34.4 g/dL (ref 30.0–36.0)
MCV: 89.5 fL (ref 78.0–100.0)
NEUTROS PCT: 63 % (ref 43–77)
Neutro Abs: 4 10*3/uL (ref 1.7–7.7)
Platelets: 213 10*3/uL (ref 150–400)
RBC: 4.38 MIL/uL (ref 3.87–5.11)
RDW: 12.8 % (ref 11.5–15.5)
WBC mixed population: 0.5 10*3/uL (ref 0.1–1.8)
WBC: 6.4 10*3/uL (ref 4.0–10.5)
WBCMIXPER: 8 % (ref 3–18)

## 2014-05-24 NOTE — Progress Notes (Signed)
Patient ID: Catherine Adams, female   DOB: 07-Dec-1975, 39 y.o.   MRN: [id number]   Subjective:    Patient ID: Catherine CordsBrandy S Adams, female    DOB: 07-Dec-1975, 39 y.o.   MRN: [id number]  Patient presents for Blood in Stool  patient here with blood in her stools. She ate raw cc on Saturday night subsequently thereafter she began having diarrhea and noticed blood in the stool. She then ate at [employer] the next day and had a lot of spicy foods and had 2 episodes of diarrhea that also had mucousy blood mixed in. She actually has pictures of them. Monday her diarrhea had resolved and she only saw a few specks of blood in her stool but it was a fairly soft stool she did not have any pain associated nausea vomiting over the past 48 hours. She now feels back to herself. She never had any fever. She has not had a bowel movement today. She denies any history of hemorrhoids she's not had any vaginal bleeding recently. No previous history of any bloody diarrhea   Review Of Systems:  GEN- denies fatigue, fever, weight loss,weakness, recent illness HEENT- denies eye drainage, change in vision, nasal discharge, CVS- denies chest pain, palpitations RESP- denies SOB, cough, wheeze ABD- denies N/V, +change in stools, abd pain GU- denies dysuria, hematuria, dribbling, incontinence MSK- denies joint pain, muscle aches, injury Neuro- denies headache, dizziness, syncope, seizure activity       Objective:    BP 124/68 mmHg  Pulse 78  Temp(Src) 98.6 F (37 C) (Oral)  Resp 16  Ht 5\' 3"  (1.6 m)  Wt 170 lb (77.111 kg)  BMI 30.12 kg/m2  LMP 05/10/2014 (Approximate) GEN- NAD, alert and oriented x3 CVS- RRR, no murmur RESP-CTAB ABD-NABS,soft,NT,ND GU- Rectum-normal apperance, no skin tags, FOBT neg, soft stool in vault, normal tone Pulses- Radial, 2+  Pics Seen-bloody mucous with stools      Assessment & Plan:      Problem List Items Addressed This Visit    None    Visit Diagnoses    Colitis,  acute    -  Primary    Acute colilitis, likley from something she ate, hemoccult is normal, benign exam, will monitor symptoms, stools now forming, CBC also normal. If symptoms return treat with flagyl/ciro empirically    Relevant Orders    CBC w/MCH & 3 Part Diff       Note: This dictation was prepared with Dragon dictation along with smaller phrase technology. Any transcriptional errors that result from this process are unintentional.

## 2014-05-24 NOTE — Patient Instructions (Signed)
Call if blood returns, push your fluids to get all toxins out No new medications Your hemoccult was negative Your CBC was normal F/U as needed

## 2014-06-06 ENCOUNTER — Encounter: Payer: Self-pay | Admitting: Family Medicine

## 2014-06-22 ENCOUNTER — Telehealth: Payer: Self-pay | Admitting: Physician Assistant

## 2014-06-22 NOTE — Telephone Encounter (Signed)
[phone] Patient would like to talk to you regarding her colitis if possible

## 2014-06-22 NOTE — Telephone Encounter (Signed)
Returned call to patient.   Reports that she ate salad on 06/21/2014 for lunch around 1pm. Reports that by 2:30pm, she was having increased abdominal pain. Reports that she had BM around 2:30pm, and she noted that she felt very hot and sweaty while on the toilet.   Reports that she has not had any further episodes of sweating, but abdominal cramping continues.   States that she has not noted any blood in stool.   Advised to increase fluids and rest.   MD please advise.

## 2014-06-22 NOTE — Telephone Encounter (Signed)
Have her schedule in office if not improved by Thursday , come in for visit

## 2014-06-23 NOTE — Telephone Encounter (Signed)
Call placed to patient. LMTRC.  

## 2014-06-23 NOTE — Telephone Encounter (Signed)
Patient returned call and states that abd pain is improved, but not completely resolved at this time.   States that she has no further episodes of sweating or hot flashes.   Advised to schedule appointment if S/Sx reappear.

## 2014-06-29 ENCOUNTER — Ambulatory Visit: Payer: 59 | Admitting: Physician Assistant

## 2014-06-29 ENCOUNTER — Ambulatory Visit (INDEPENDENT_AMBULATORY_CARE_PROVIDER_SITE_OTHER): Payer: 59 | Admitting: Family Medicine

## 2014-06-29 ENCOUNTER — Encounter: Payer: Self-pay | Admitting: Family Medicine

## 2014-06-29 VITALS — BP 122/68 | HR 70 | Temp 98.3°F | Resp 12 | Ht 63.0 in | Wt 165.0 lb

## 2014-06-29 DIAGNOSIS — G8929 Other chronic pain: Secondary | ICD-10-CM | POA: Diagnosis not present

## 2014-06-29 DIAGNOSIS — R109 Unspecified abdominal pain: Secondary | ICD-10-CM

## 2014-06-29 DIAGNOSIS — K6289 Other specified diseases of anus and rectum: Secondary | ICD-10-CM

## 2014-06-29 NOTE — Progress Notes (Signed)
Patient ID: Catherine Adams, female   DOB: [birth year], 39 y.o.   MRN: [id number]   Subjective:    Patient ID: Catherine CordsBrandy S Adams, female    DOB: [birth year], 39 y.o.   MRN: [id number]  Patient presents for Rectal Pain  Pt here with recurrent abdominal pain and rectal pain. Seen 1 month ago with N/V diarrhea blood in stool after eating out, this episode quickly resloved, has had bouts of pain on and off since then associated with diaphoresis, sometimes loose bowels other times normal. Denies blood instool. For past week has had pressure and pain in rectum, even when she is not having a BM.Denies hemorroids    Review Of Systems:  GEN- denies fatigue, fever, weight loss,weakness, recent illness HEENT- denies eye drainage, change in vision, nasal discharge, CVS- denies chest pain, palpitations RESP- denies SOB, cough, wheeze ABD- denies N/V, change in stools, +abd pain GU- denies dysuria, hematuria, dribbling, incontinence MSK- denies joint pain, muscle aches, injury Neuro- denies headache, dizziness, syncope, seizure activity       Objective:    BP 122/68 mmHg  Pulse 70  Temp(Src) 98.3 F (36.8 C) (Oral)  Resp 12  Ht 5\' 3"  (1.6 m)  Wt 165 lb (74.844 kg)  BMI 29.24 kg/m2 GEN- NAD, alert and oriented x3 HEENT- PERRL, EOMI, non injected sclera, pink conjunctiva, MMM, oropharynx clear Neck- Supple, no thyromegaly CVS- RRR, no murmur RESP-CTAB ABD-NABS,soft,NT,ND no rebound no guarding Rectum- normal tone, soft stool in vault, no henmorroids seen, FOBT neg, no tears EXT- No edema Pulses- Radial, DP- 2+        Assessment & Plan:      Problem List Items Addressed This Visit    None    Visit Diagnoses    Chronic abdominal pain    -  Primary    ? IBS vs other coliltis, now with rectal involvment, CT scan to be done, benign exam today but recurrent episodes of pain. CBC CMET,     Relevant Orders    CBC with Differential/Platelet    Comprehensive metabolic panel    Rectal  pain           Note: This dictation was prepared with Dragon dictation along with smaller phrase technology. Any transcriptional errors that result from this process are unintentional.

## 2014-06-29 NOTE — Patient Instructions (Signed)
CT scan to be done  We will call with results Plenty of fluids Add fiber to diet to help with bowel movements F/U pending results

## 2014-06-30 ENCOUNTER — Telehealth: Payer: Self-pay | Admitting: *Deleted

## 2014-06-30 LAB — CBC WITH DIFFERENTIAL/PLATELET
BASOS ABS: 0 10*3/uL (ref 0.0–0.1)
Basophils Relative: 0 % (ref 0–1)
EOS ABS: 0.1 10*3/uL (ref 0.0–0.7)
EOS PCT: 1 % (ref 0–5)
HCT: 40.5 % (ref 36.0–46.0)
Hemoglobin: 13.6 g/dL (ref 12.0–15.0)
Lymphocytes Relative: 29 % (ref 12–46)
Lymphs Abs: 2.3 10*3/uL (ref 0.7–4.0)
MCH: 29.9 pg (ref 26.0–34.0)
MCHC: 33.6 g/dL (ref 30.0–36.0)
MCV: 89 fL (ref 78.0–100.0)
MPV: 10.1 fL (ref 8.6–12.4)
Monocytes Absolute: 0.6 10*3/uL (ref 0.1–1.0)
Monocytes Relative: 8 % (ref 3–12)
Neutro Abs: 4.9 10*3/uL (ref 1.7–7.7)
Neutrophils Relative %: 62 % (ref 43–77)
PLATELETS: 275 10*3/uL (ref 150–400)
RBC: 4.55 MIL/uL (ref 3.87–5.11)
RDW: 12.8 % (ref 11.5–15.5)
WBC: 7.9 10*3/uL (ref 4.0–10.5)

## 2014-06-30 LAB — COMPREHENSIVE METABOLIC PANEL
ALK PHOS: 56 U/L (ref 39–117)
ALT: 10 U/L (ref 0–35)
AST: 10 U/L (ref 0–37)
Albumin: 4.4 g/dL (ref 3.5–5.2)
BILIRUBIN TOTAL: 0.5 mg/dL (ref 0.2–1.2)
BUN: 11 mg/dL (ref 6–23)
CO2: 26 mEq/L (ref 19–32)
Calcium: 9.3 mg/dL (ref 8.4–10.5)
Chloride: 104 mEq/L (ref 96–112)
Creat: 0.62 mg/dL (ref 0.50–1.10)
GLUCOSE: 84 mg/dL (ref 70–99)
POTASSIUM: 3.9 meq/L (ref 3.5–5.3)
Sodium: 139 mEq/L (ref 135–145)
TOTAL PROTEIN: 6.8 g/dL (ref 6.0–8.3)

## 2014-06-30 NOTE — Telephone Encounter (Signed)
[facility] at [facility]10 arrival for 10:30 appointment, but is to come to office to pick up contrast adn drink first bottle at 830am and 2nd at 930am pt is to not have anything to eat 4hrs prior to her exam. Left message on pt vm to return for appt. Contrast left up front to pick up with instructions

## 2014-06-30 NOTE — Telephone Encounter (Signed)
Pt called back and aware of apapt

## 2014-07-07 ENCOUNTER — Ambulatory Visit
Admission: RE | Admit: 2014-07-07 | Discharge: 2014-07-07 | Disposition: A | Discharge status: Home / Self Care | Payer: 59 | Source: Ambulatory Visit | Attending: Family Medicine | Admitting: Family Medicine

## 2014-07-07 DIAGNOSIS — K6289 Other specified diseases of anus and rectum: Secondary | ICD-10-CM

## 2014-07-07 DIAGNOSIS — R109 Unspecified abdominal pain: Principal | ICD-10-CM

## 2014-07-07 DIAGNOSIS — G8929 Other chronic pain: Secondary | ICD-10-CM

## 2014-07-07 MED ORDER — IOPAMIDOL (ISOVUE-300) INJECTION 61%
100.0000 mL | Freq: Once | INTRAVENOUS | Status: AC | PRN
  Administered 2014-07-07: 100 mL via INTRAVENOUS

## 2014-09-07 ENCOUNTER — Other Ambulatory Visit: Payer: Self-pay | Admitting: Physician Assistant

## 2014-09-07 NOTE — Telephone Encounter (Signed)
Approved. #30+2. 

## 2014-09-07 NOTE — Telephone Encounter (Signed)
Medication refilled per protocol. 

## 2014-09-07 NOTE — Telephone Encounter (Signed)
LRF 03/10/14 #30 + 2.  LOV 06/29/14  OK refill?

## 2014-09-20 ENCOUNTER — Ambulatory Visit (INDEPENDENT_AMBULATORY_CARE_PROVIDER_SITE_OTHER): Payer: 59 | Admitting: Family Medicine

## 2014-09-20 ENCOUNTER — Encounter: Payer: Self-pay | Admitting: Family Medicine

## 2014-09-20 VITALS — BP 122/68 | HR 70 | Temp 99.1°F | Resp 16 | Ht 63.0 in | Wt 165.0 lb

## 2014-09-20 DIAGNOSIS — J020 Streptococcal pharyngitis: Secondary | ICD-10-CM

## 2014-09-20 DIAGNOSIS — B001 Herpesviral vesicular dermatitis: Secondary | ICD-10-CM

## 2014-09-20 LAB — RAPID STREP SCREEN (MED CTR MEBANE ONLY): Streptococcus, Group A Screen (Direct): POSITIVE — AB

## 2014-09-20 MED ORDER — ACYCLOVIR 5 % EX OINT: 1.0000 "application " | TOPICAL_OINTMENT | CUTANEOUS | Status: DC

## 2014-09-20 MED ORDER — AZITHROMYCIN 250 MG PO TABS: ORAL_TABLET | ORAL | Status: DC

## 2014-09-20 NOTE — Progress Notes (Signed)
Patient ID: Catherine Adams, female   DOB: 1975/10/02, 39 y.o.   MRN: [id number]   Subjective:    Patient ID: Catherine Adams, female    DOB: Dec 30, 1975, 39 y.o.   MRN: [id number]  Patient presents for Illness  patient here with sore throat for the past 2 days. Positive sick contact with her son who had strep throat. She's also had some ear pressure but no drainage from the ears. She has not had any significant fever no headache no cough no sinus pressure. She has not taken any over-the-counter medications. She did have a cold sore pop up as well which she does get on and off as well as her husband.    Review Of Systems: per above   GEN- denies fatigue, fever, weight loss,weakness, recent illness HEENT- denies eye drainage, change in vision, nasal discharge, CVS- denies chest pain, palpitations RESP- denies SOB, cough, wheeze ABD- denies N/V, change in stools, abd pain Neuro- denies headache, dizziness, syncope, seizure activity       Objective:    BP 122/68 mmHg  Pulse 70  Temp(Src) 99.1 F (37.3 C) (Oral)  Resp 16  Ht  (1.6 m)  Wt 165 lb (74.844 kg)  BMI 29.24 kg/m2  LMP 08/22/2014 (Within Days) GEN- NAD, alert and oriented x3 HEENT- PERRL, EOMI, non injected sclera, pink conjunctiva, MMM, oropharynx + injection, no exudates TM clear bilat no effusion,  No maxillary sinus tenderness, nares clear, Lower lip- small fever blister corner of mouth, mild erythema, peeling skin Neck- Supple, + anterior LAD, submandibular LAD CVS- RRR, no murmur RESP-CTAB EXT- No edema Pulses- Radial 2+          Assessment & Plan:      Problem List Items Addressed This Visit    None    Visit Diagnoses    Strep pharyngitis    -  Primary     Positive strep throat. Will treat with azithromycin as she gets severe yeast infections with other antibodies. If this is not cleared up however we'll send in     Relevant Medications    azithromycin (ZITHROMAX) 250 MG tablet    acyclovir  ointment (ZOVIRAX) 5 %    Other Relevant Orders    Rapid Strep Screen (Completed)    Fever blister        Topical Zovirax    Relevant Medications    azithromycin (ZITHROMAX) 250 MG tablet    acyclovir ointment (ZOVIRAX) 5 %       Note: This dictation was prepared with Dragon dictation along with smaller phrase technology. Any transcriptional errors that result from this process are unintentional.

## 2014-09-20 NOTE — Patient Instructions (Signed)
Take antibiotics as prescribed Use cream for fever blister F/U as needed

## 2014-09-23 ENCOUNTER — Telehealth: Payer: Self-pay | Admitting: Physician Assistant

## 2014-09-23 MED ORDER — FLUCONAZOLE 150 MG PO TABS: ORAL_TABLET | ORAL | Status: DC

## 2014-09-23 MED ORDER — AMOXICILLIN 500 MG PO CAPS: 500.0000 mg | ORAL_CAPSULE | Freq: Three times a day (TID) | ORAL | Status: DC

## 2014-09-23 MED ORDER — FLUCONAZOLE 150 MG PO TABS: 150.0000 mg | ORAL_TABLET | Freq: Once | ORAL | Status: DC

## 2014-09-23 NOTE — Telephone Encounter (Signed)
Call placed to patient and patient made aware.   Patient states that yeast begins approximately 3 days into Amoxicillin treatment.   Prescription sent to pharmacy for Diflucan (1) tab PO q 3 days while on ABTx.   Advised that prescription sent to regular pharmacy ([facility]) but if noted to be too expensive, we can re=-send to [facility] as Amoxicillin is on $4 list.

## 2014-09-23 NOTE — Telephone Encounter (Signed)
Patient returned call.   States that she is having discomfort with swallowing and redness to throat. Reports that she continues to have nausea and fever.   Reports that she has taken Diflucan in the past with no reaction.   Also reports that insurance has not started and would like cheapest ABTx if possible.

## 2014-09-23 NOTE — Telephone Encounter (Signed)
[phone] Patient calling to say she is not better from her visit last when diagnosed with strep, would like to know if another antibiotic can be called in, like discussed and also something for yeast infection just in case  [facility]

## 2014-09-23 NOTE — Telephone Encounter (Signed)
Patient was seen on 09/20/2014 and given Z-pack for Strep. Should not have completed at this time. Call placed to inquire. LMTRC.  Patient is allergic to monitstat. Ok to send in Diflucan?

## 2014-09-23 NOTE — Telephone Encounter (Signed)
Send in amox  TID x 10 days, diflucan  po x 1- okay to refill

## 2015-01-04 ENCOUNTER — Ambulatory Visit (INDEPENDENT_AMBULATORY_CARE_PROVIDER_SITE_OTHER): Payer: 59 | Admitting: Physician Assistant

## 2015-01-04 ENCOUNTER — Encounter: Payer: Self-pay | Admitting: Physician Assistant

## 2015-01-04 VITALS — BP 112/72 | HR 76 | Temp 98.4°F | Resp 18 | Wt 169.0 lb

## 2015-01-04 DIAGNOSIS — J019 Acute sinusitis, unspecified: Secondary | ICD-10-CM | POA: Diagnosis not present

## 2015-01-04 MED ORDER — AMOXICILLIN-POT CLAVULANATE 875-125 MG PO TABS: 1.0000 | ORAL_TABLET | Freq: Two times a day (BID) | ORAL | Status: DC

## 2015-01-04 MED ORDER — FLUCONAZOLE 150 MG PO TABS: 150.0000 mg | ORAL_TABLET | Freq: Once | ORAL | Status: DC

## 2015-01-04 MED ORDER — PREDNISONE 20 MG PO TABS: ORAL_TABLET | ORAL | Status: DC

## 2015-01-04 NOTE — Progress Notes (Signed)
    Patient ID: Catherine CordsBrandy S Hinkley MRN: [id number], DOB: 1975-09-25, 39 y.o. Date of Encounter: 01/04/2015, 11:10 AM    Chief Complaint:  Chief Complaint  Patient presents with  . sick x 1 week    sinus infection, thick green secretions     HPI: 39 y.o. year old female recent with above symptoms. Says that her nose isn't really runny but when she does make herself blow it comes out green. As a lot of pressure and congestion in her sinuses. No chest congestion. Has a little bit of a [place] cough secondary to drainage in her throat but no congestion in her chest. No significant sore throat. No fevers or chills.     Home Meds:   Outpatient Prescriptions Prior to Visit  Medication Sig Dispense Refill  . acyclovir ointment (ZOVIRAX) 5 % Apply 1 application topically every 3 (three) hours. For 5 days as needed for cold sores 15 g 2  . ALPRAZolam (XANAX) 0.25 MG tablet TAKE 1 TABLET BY MOUTH AT BEDTIME AS NEEDED 30 tablet 3  . amoxicillin (AMOXIL) 500 MG capsule Take 1 capsule (500 mg total) by mouth 3 (three) times daily. 30 capsule 0  . fluconazole (DIFLUCAN) 150 MG tablet Take 1 tablet by mouth every 3 days. 3 tablet 0   No facility-administered medications prior to visit.    Allergies:  Allergies  Allergen Reactions  . Codeine     REACTION: N\T\V  . Monistat [Miconazole Nitrate-Wipes]       Review of Systems: See HPI for pertinent ROS. All other ROS negative.    Physical Exam: Blood pressure 112/72, pulse 76, temperature 98.4 F (36.9 C), resp. rate 18, weight 169 lb (76.658 kg)., Body mass index is 29.94 kg/(m^2). General:  WNWD WF. Appears in no acute distress. HEENT: Normocephalic, atraumatic, eyes without discharge, sclera non-icteric, nares are without discharge. Bilateral auditory canals clear, TM's are without perforation, pearly grey and translucent with reflective cone of light bilaterally. Oral cavity moist, posterior pharynx without exudate, erythema, peritonsillar  abscess. Mild tenderness with percussion of frontal and maxillary sinuses bilaterally.  Neck: Supple. No thyromegaly. No lymphadenopathy. Lungs: Clear bilaterally to auscultation without wheezes, rales, or rhonchi. Breathing is unlabored. Heart: Regular rhythm. No murmurs, rubs, or gallops. Msk:  Strength and tone normal for age. Extremities/Skin: Warm and dry.  Neuro: Alert and oriented X 3. Moves all extremities spontaneously. Gait is normal. CNII-XII grossly in tact. Psych:  Responds to questions appropriately with a normal affect.     ASSESSMENT AND PLAN:  39 y.o. year old female with  1. Acute sinusitis, recurrence not specified, unspecified location She is concerned that she frequently gets yeast infection when she takes antibiotics. She is to use the Diflucan if she starts developing signs and symptoms of [place] infection. He is to take the antibiotic as directed and complete all of it. She is to take the prednisone taper as directed. Follow-up if symptoms do not resolve upon completion. - amoxicillin-clavulanate (AUGMENTIN) 875-125 MG tablet; Take 1 tablet by mouth 2 (two) times daily.  Dispense: 20 tablet; Refill: 0 - predniSONE (DELTASONE) 20 MG tablet; Take 3 daily for 2 days, then 2 daily for 2 days, then 1 daily for 2 days.  Dispense: 12 tablet; Refill: 0 - fluconazole (DIFLUCAN) 150 MG tablet; Take 1 tablet (150 mg total) by mouth once.  Dispense: 1 tablet; Refill: 0   Signed, [address] [place], [place], [facility] 01/04/2015 11:10 AM

## 2015-01-06 ENCOUNTER — Telehealth: Payer: Self-pay | Admitting: Family Medicine

## 2015-01-06 MED ORDER — HYDROCODONE-HOMATROPINE 5-1.5 MG/5ML PO SYRP: 5.0000 mL | ORAL_SOLUTION | Freq: Two times a day (BID) | ORAL | Status: DC | PRN

## 2015-01-06 NOTE — Telephone Encounter (Signed)
Rx printed (MBD ask please to have KD sign)  Pt called and left message need to pick up

## 2015-01-06 NOTE — Telephone Encounter (Signed)
Seen the other day.  Still with terrible cough, worse at night.  Can she get something to help.  OTC's are not helping.  Please advise??

## 2015-01-06 NOTE — Telephone Encounter (Signed)
Hycodan 5ml Q 12 H prn

## 2015-03-20 ENCOUNTER — Other Ambulatory Visit: Payer: Self-pay | Admitting: Physician Assistant

## 2015-03-20 NOTE — Telephone Encounter (Signed)
rx called in

## 2015-03-20 NOTE — Telephone Encounter (Signed)
Approved. #30+2. 

## 2015-03-20 NOTE — Telephone Encounter (Signed)
LRF 09/07/14 #30 + 3.  LOV 01/04/15  OK refill?

## 2015-03-28 ENCOUNTER — Ambulatory Visit (INDEPENDENT_AMBULATORY_CARE_PROVIDER_SITE_OTHER): Payer: Commercial Managed Care - HMO | Admitting: Family Medicine

## 2015-03-28 ENCOUNTER — Encounter: Payer: Self-pay | Admitting: Family Medicine

## 2015-03-28 VITALS — BP 118/78 | HR 82 | Temp 98.6°F | Resp 16 | Ht 63.0 in | Wt 171.0 lb

## 2015-03-28 DIAGNOSIS — L905 Scar conditions and fibrosis of skin: Secondary | ICD-10-CM | POA: Diagnosis not present

## 2015-03-28 NOTE — Progress Notes (Signed)
Patient ID: Catherine Adams, female   DOB: November 14, 1975, 40 y.o.   MRN: [id number]   Subjective:    Patient ID: Catherine Adams, female    DOB: 1975/10/02, 40 y.o.   MRN: [id number]  Patient presents for L Sided Breast Pain  Pt here with  left breast pain. She actually had some type of abscess about a year and a half ago her gastroenterologist did an IND since then she still has soreness especially when she wears an underwire bra she denies feeling any mass or lump but states he just feels sore in the same spot. Occasionally throughout the month she will get soreness in both breasts. She's not had any drainage from the nipple. She does have breast cancer in her maternal grandmother.    Review Of Systems:  GEN- denies fatigue, fever, weight loss,weakness, recent illness HEENT- denies eye drainage, change in vision, nasal discharge, CVS- denies chest pain, palpitations RESP- denies SOB, cough, wheeze Neuro- denies headache, dizziness, syncope, seizure activity       Objective:    BP 118/78 mmHg  Pulse 82  Temp(Src) 98.6 F (37 C) (Oral)  Resp 16  Ht  (1.6 m)  Wt 171 lb (77.565 kg)  BMI 30.30 kg/m2 GEN- NAD, alert and oriented x3 Breast- normal symmetry, no nipple inversion,no nipple drainage, no nodules or lumps felt , left breast near sternum small well healed scar, with a little scar tissue directly under linear scar, no mass, no nodules, no fluctuance  Nodes- no axillary nodes         Assessment & Plan:      Problem List Items Addressed This Visit    None    Visit Diagnoses    Scar tissue    -  Primary    Scar tissue previous I and D site, no mass palapted, no nodules, try bra without underwire, After discussion decided to call if any changes and [place] will be done.        Note: This dictation was prepared with Dragon dictation along with smaller phrase technology. Any transcriptional errors that result from this process are unintentional.

## 2015-03-28 NOTE — Patient Instructions (Signed)
Call for any changes F/U as needed  

## 2015-03-29 ENCOUNTER — Ambulatory Visit: Payer: 59 | Admitting: Family Medicine

## 2015-05-17 ENCOUNTER — Encounter: Payer: Self-pay | Admitting: Family Medicine

## 2015-05-17 ENCOUNTER — Ambulatory Visit (INDEPENDENT_AMBULATORY_CARE_PROVIDER_SITE_OTHER): Payer: Commercial Managed Care - HMO | Admitting: Family Medicine

## 2015-05-17 ENCOUNTER — Ambulatory Visit: Payer: Commercial Managed Care - HMO | Admitting: Family Medicine

## 2015-05-17 VITALS — BP 114/76 | HR 80 | Temp 98.4°F | Resp 18 | Wt 166.0 lb

## 2015-05-17 DIAGNOSIS — J019 Acute sinusitis, unspecified: Secondary | ICD-10-CM | POA: Diagnosis not present

## 2015-05-17 MED ORDER — AZITHROMYCIN 250 MG PO TABS: ORAL_TABLET | ORAL | Status: DC

## 2015-05-17 NOTE — Progress Notes (Signed)
Patient ID: Catherine Adams, female   DOB: March 24, 1975, 40 y.o.   MRN: [id number]    Subjective:    Patient ID: Catherine CordsBrandy S Adams, female    DOB: March 24, 1975, 40 y.o.   MRN: [id number]  Patient presents for sick x 3 days Patient with sinus pressure or drainage here popping for the past 3 days. She's been using Mucinex and Nettie pot. She has not had any fever. Currently the drainage is still clear. She's not had any significant cough.    Review Of Systems:  GEN- denies fatigue, fever, weight loss,weakness, recent illness HEENT- denies eye drainage, change in vision, +nasal discharge, CVS- denies chest pain, palpitations RESP- denies SOB, cough, wheeze ABD- denies N/V, change in stools, abd pain GU- denies dysuria, hematuria, dribbling, incontinence MSK- denies joint pain, muscle aches, injury Neuro-+ headache, dizziness, syncope, seizure activity       Objective:    BP 114/76 mmHg  Pulse 80  Temp(Src) 98.4 F (36.9 C) (Oral)  Resp 18  Wt 166 lb (75.297 kg) GEN- NAD, alert and oriented x3 HEENT- PERRL, EOMI, non injected sclera, pink conjunctiva, MMM, oropharynx clear, TM small amount of clear fluid bilat   + maxillary sinus tenderness,clear   Nasal drainage  Neck- Supple, no LAD CVS- RRR, no murmur RESP-CTAB Pulses- Radial 2+         Assessment & Plan:      Problem List Items Addressed This Visit    None    Visit Diagnoses    Acute rhinosinusitis    -  Primary    Early sinusitis. I recommend that she use Sudafed to decongest continue the Mucinex and Nettie pot better by in the week she can start the azithromycin. Note she gets significant use infections with amoxicillin     Relevant Medications    azithromycin (ZITHROMAX) 250 MG tablet       Note: This dictation was prepared with Dragon dictation along with smaller phrase technology. Any transcriptional errors that result from this process are unintentional.

## 2015-05-17 NOTE — Patient Instructions (Addendum)
Sudafed take for 3-4 days to decongest Take mucinex  Continue netty pot  tAKE ZPAK if not better  F/U Schedule a physical come in fasting

## 2015-05-31 ENCOUNTER — Encounter: Payer: Self-pay | Admitting: Family Medicine

## 2015-05-31 ENCOUNTER — Ambulatory Visit (INDEPENDENT_AMBULATORY_CARE_PROVIDER_SITE_OTHER): Payer: Commercial Managed Care - HMO | Admitting: Family Medicine

## 2015-05-31 VITALS — BP 122/70 | HR 68 | Temp 97.9°F | Resp 14 | Ht 63.0 in | Wt 166.0 lb

## 2015-05-31 DIAGNOSIS — K6289 Other specified diseases of anus and rectum: Secondary | ICD-10-CM

## 2015-05-31 NOTE — Progress Notes (Signed)
Patient ID: Catherine Adams, female   DOB: Oct 05, 1975, 40 y.o.   MRN: [id number]    Subjective:    Patient ID: Catherine Adams, female    DOB: Oct 05, 1975, 40 y.o.   MRN: [id number]  Patient presents for Rectal Itching  Patient here with rectal itching for the past 3 days. She has wheezing which with a PEEP are typically she uses wipes to keep herself clean. She's not had any straining with bowel movements no blood in the stool she does not have any history of hemorrhoids. She denies any vaginal discharge. She actually took 2 Diflucan to see if that would help she also uses nystatin mixed with triamcinolone cheese betamethasone she'll seize hemorrhoidal cream none of them seem to help with the itching. States it has started to simmer down     Review Of Systems:  GEN- denies fatigue, fever, weight loss,weakness, recent illness HEENT- denies eye drainage, change in vision, nasal discharge, CVS- denies chest pain, palpitations RESP- denies SOB, cough, wheeze ABD- denies N/V, change in stools, abd pain GU- denies dysuria, hematuria, dribbling, incontinence Neuro- denies headache, dizziness, syncope, seizure activity       Objective:    BP 122/70 mmHg  Pulse 68  Temp(Src) 97.9 F (36.6 C) (Oral)  Resp 14  Ht 5\' 3"  (1.6 m)  Wt 166 lb (75.297 kg)  BMI 29.41 kg/m2 GEN- NAD, alert and oriented x3 Rectum - norma appearance, no external tags, no ulcerations , no gross blood, normal tone   mild irritation at gluteal crease         Assessment & Plan:      Problem List Items Addressed This Visit    None    Visit Diagnoses    Rectal irritation    -  Primary    no ulceration, no sign of infection, no hemorroids, will use wipes and use A & D ointment        Note: This dictation was prepared with Dragon dictation along with smaller phrase technology. Any transcriptional errors that result from this process are unintentional.

## 2015-05-31 NOTE — Patient Instructions (Signed)
Use wipes Use A & D ointment  F/U as needed

## 2015-05-31 NOTE — Addendum Note (Signed)
Addended by: Milinda AntisURHAM, Riely Oetken F on: 05/31/2015 09:44 AM   Modules accepted: Level of Service

## 2015-07-28 ENCOUNTER — Ambulatory Visit (INDEPENDENT_AMBULATORY_CARE_PROVIDER_SITE_OTHER): Payer: Commercial Managed Care - HMO | Admitting: Family Medicine

## 2015-07-28 ENCOUNTER — Encounter: Payer: Self-pay | Admitting: Family Medicine

## 2015-07-28 VITALS — BP 118/66 | HR 72 | Temp 98.5°F | Resp 14 | Ht 63.0 in | Wt 157.0 lb

## 2015-07-28 DIAGNOSIS — H9201 Otalgia, right ear: Secondary | ICD-10-CM | POA: Diagnosis not present

## 2015-07-28 MED ORDER — NEOMYCIN-POLYMYXIN-HC 3.5-10000-1 OT SOLN: 3.0000 [drp] | Freq: Three times a day (TID) | OTIC | Status: DC

## 2015-07-28 NOTE — Progress Notes (Signed)
Patient ID: Catherine CordsBrandy S Ground, female   DOB: 1975/12/29, 40 y.o.   MRN: [id number]    Subjective:    Patient ID: Catherine Adams, female    DOB: 1975/12/29, 40 y.o.   MRN: [id number]  Patient presents for R Ear Pain Here with right ear pain that started yesterday. She states the pain was initially just in the canal but now it is down behind her ear. She has not had any drainage and sinus pressure no fever no recent illness. She's not had any water in her ear that she is aware of. She did use some Q-tips recently.    Review Of Systems:  GEN- denies fatigue, fever, weight loss,weakness, recent illness HEENT- denies eye drainage, change in vision, nasal discharge, CVS- denies chest pain, palpitations RESP- denies SOB, cough, wheeze Neuro- denies headache, dizziness, syncope, seizure activity       Objective:    BP 118/66 mmHg  Pulse 72  Temp(Src) 98.5 F (36.9 C) (Oral)  Resp 14  Ht 5\' 3"  (1.6 m)  Wt 157 lb (71.215 kg)  BMI 27.82 kg/m2 GEN- NAD, alert and oriented x3 HEENT- PERRL, EOMI, non injected sclera, pink conjunctiva, MMM, oropharynx clear, Bilat TM clear, mild erythema of right canal, no swelling, canal clear left side Neck- Supple, Ant auricaular node on Right side, no post nodes         Assessment & Plan:      Problem List Items Addressed This Visit    None    Visit Diagnoses    Otalgia, right    -  Primary    possible early OE with the node present and erythema she is leaving for a lake trip, will go ahead and give otic drops Advised no q tips       Note: This dictation was prepared with Dragon dictation along with smaller phrase technology. Any transcriptional errors that result from this process are unintentional.

## 2015-07-28 NOTE — Patient Instructions (Signed)
Use ear drop  F/U as needed

## 2015-11-05 ENCOUNTER — Other Ambulatory Visit: Payer: Self-pay | Admitting: Physician Assistant

## 2015-11-06 ENCOUNTER — Other Ambulatory Visit: Payer: Self-pay

## 2015-11-06 NOTE — Telephone Encounter (Signed)
error 

## 2015-11-06 NOTE — Telephone Encounter (Signed)
Last OV 07/28/15 Last refill 1/30 Okay to refill?

## 2015-11-06 NOTE — Telephone Encounter (Signed)
Approved. # [id number] + 0. 

## 2015-11-07 NOTE — Telephone Encounter (Signed)
rx called in

## 2016-06-11 ENCOUNTER — Encounter: Payer: Self-pay | Admitting: Family Medicine

## 2016-06-11 ENCOUNTER — Ambulatory Visit (INDEPENDENT_AMBULATORY_CARE_PROVIDER_SITE_OTHER): Payer: Commercial Managed Care - HMO | Admitting: Family Medicine

## 2016-06-11 VITALS — BP 124/68 | HR 80 | Temp 98.7°F | Resp 14 | Ht 63.0 in | Wt 166.0 lb

## 2016-06-11 DIAGNOSIS — R0982 Postnasal drip: Secondary | ICD-10-CM

## 2016-06-11 DIAGNOSIS — J309 Allergic rhinitis, unspecified: Secondary | ICD-10-CM

## 2016-06-11 MED ORDER — TRIAMCINOLONE ACETONIDE 55 MCG/ACT NA AERO: 2.0000 | INHALATION_SPRAY | Freq: Every day | NASAL | 3 refills | Status: DC

## 2016-06-11 MED ORDER — BENZONATATE 200 MG PO CAPS: 200.0000 mg | ORAL_CAPSULE | Freq: Three times a day (TID) | ORAL | 0 refills | Status: DC | PRN

## 2016-06-11 NOTE — Progress Notes (Signed)
   Subjective:    Patient ID: Catherine Adams, female    DOB: Jan 04, 1976, 41 y.o.   MRN: [id number]  Patient presents for Illness (x1 day- nasal drainage, chest congestion, sore throat)   Cough with mild production , mild headache, sore throat with post nasal drip for past 2 days. Has been taking zyrtec, missed a few days. Subjective fever, was fatgiued   Took cough medicine that did not help only 1 dose         Review Of Systems:  GEN- denies fatigue, fever, weight loss,weakness, recent illness HEENT- denies eye drainage, change in vision, nasal discharge, CVS- denies chest pain, palpitations RESP- denies SOB, cough, wheeze ABD- denies N/V, change in stools, abd pain GU- denies dysuria, hematuria, dribbling, incontinence MSK- denies joint pain, muscle aches, injury Neuro- denies headache, dizziness, syncope, seizure activity       Objective:    BP 124/68   Pulse 80   Temp 98.7 F (37.1 C) (Oral)   Resp 14   Ht  (1.6 m)   Wt 166 lb (75.3 kg)   SpO2 98%   BMI 29.41 kg/m  GEN- NAD, alert and oriented x3 HEENT- PERRL, EOMI, non injected sclera, pink conjunctiva, MMM, oropharynx mild injection, TM clear bilat no effusion,  No maxillary sinus tenderness, inflammed turbinates,  +Nasal drainage  Neck- Supple, no LAD CVS- RRR, no murmur RESP-CTAB Pulses- Radial 2+          Assessment & Plan:      Problem List Items Addressed This Visit    None    Visit Diagnoses    Post-nasal drip    -  Primary   Symptoms due to pollen and allergies, no sign of bacterial infection. She has had sinusitis in past, advised to use netty pot, nasocort, anti-histamine, tessalon for cough Can call us Friday if not improved- will Rx ZPAK   Allergic sinusitis       Relevant Medications   triamcinolone (NASACORT ALLERGY 24HR) 55 MCG/ACT AERO nasal inhaler   benzonatate (TESSALON) 200 MG capsule      Note: This dictation was prepared with Dragon dictation along with smaller  phrase technology. Any transcriptional errors that result from this process are unintentional.

## 2016-06-11 NOTE — Patient Instructions (Addendum)
Use nasal spray Take the zyrtec  netty pot  Cough pill sent in  Call if not improved  F/U schedule a physical within the next physical

## 2016-06-12 ENCOUNTER — Telehealth: Payer: Self-pay | Admitting: *Deleted

## 2016-06-12 MED ORDER — HYDROCODONE-HOMATROPINE 5-1.5 MG/5ML PO SYRP: 5.0000 mL | ORAL_SOLUTION | Freq: Four times a day (QID) | ORAL | 0 refills | Status: DC | PRN

## 2016-06-12 NOTE — Telephone Encounter (Signed)
Call placed to patient and patient made aware.   Requested prescription for Hycodan.   Prescription printed and patient made aware to come to office to pick up after 2pm.

## 2016-06-12 NOTE — Telephone Encounter (Signed)
I dont recommend zpak, too early for bacterial infection Like we discussed YESTERDAY She can't take codiene, you can offer her hycodan or tussionex she has too pick up Salt water gargle and the allergy medications

## 2016-06-12 NOTE — Telephone Encounter (Signed)
Received call from patient.   Reports that cough has worsened, and tessalon is ineffective. States that she was not able to sleep last night due to increased coughing. Reports that throat is extremely sore and it is hard for her to swallow.   Denies SOB or fever. Requested Z Pack.   MD please advise.   [phone]~ telephone.

## 2016-06-13 ENCOUNTER — Telehealth: Payer: Self-pay | Admitting: Physician Assistant

## 2016-06-13 NOTE — Telephone Encounter (Signed)
[facility] [address]  Patient calling to say that dr Jeanice Lim had said that if she was not better by today, to let her know and a zpak could be called in  She Is no better , would like the zpak  [phone]

## 2016-06-13 NOTE — Telephone Encounter (Signed)
Patient continues to voice C/O sore throat, cough and fatigue.   Denies fever, SOB.   MD please advise.

## 2016-06-14 ENCOUNTER — Other Ambulatory Visit: Payer: Self-pay | Admitting: *Deleted

## 2016-06-14 MED ORDER — AZITHROMYCIN 250 MG PO TABS: ORAL_TABLET | ORAL | 0 refills | Status: DC

## 2016-06-14 NOTE — Telephone Encounter (Signed)
Okay to send in zpak 

## 2016-06-14 NOTE — Telephone Encounter (Signed)
Prescription sent to pharmacy.   Call placed to patient and patient made aware per VM. 

## 2016-06-24 DIAGNOSIS — Z01419 Encounter for gynecological examination (general) (routine) without abnormal findings: Secondary | ICD-10-CM | POA: Diagnosis not present

## 2016-07-08 ENCOUNTER — Ambulatory Visit: Payer: Commercial Managed Care - HMO | Admitting: Physician Assistant

## 2016-07-12 ENCOUNTER — Ambulatory Visit: Payer: Commercial Managed Care - HMO | Admitting: Family Medicine

## 2016-07-29 DIAGNOSIS — M5137 Other intervertebral disc degeneration, lumbosacral region: Secondary | ICD-10-CM | POA: Diagnosis not present

## 2016-07-29 DIAGNOSIS — M9905 Segmental and somatic dysfunction of pelvic region: Secondary | ICD-10-CM | POA: Diagnosis not present

## 2016-07-29 DIAGNOSIS — M9903 Segmental and somatic dysfunction of lumbar region: Secondary | ICD-10-CM | POA: Diagnosis not present

## 2016-07-31 DIAGNOSIS — M9905 Segmental and somatic dysfunction of pelvic region: Secondary | ICD-10-CM | POA: Diagnosis not present

## 2016-07-31 DIAGNOSIS — M5137 Other intervertebral disc degeneration, lumbosacral region: Secondary | ICD-10-CM | POA: Diagnosis not present

## 2016-07-31 DIAGNOSIS — M9903 Segmental and somatic dysfunction of lumbar region: Secondary | ICD-10-CM | POA: Diagnosis not present

## 2016-08-01 ENCOUNTER — Ambulatory Visit (INDEPENDENT_AMBULATORY_CARE_PROVIDER_SITE_OTHER): Payer: Commercial Managed Care - HMO | Admitting: Family Medicine

## 2016-08-01 ENCOUNTER — Encounter: Payer: Self-pay | Admitting: Family Medicine

## 2016-08-01 ENCOUNTER — Ambulatory Visit: Payer: Commercial Managed Care - HMO | Admitting: Physician Assistant

## 2016-08-01 VITALS — BP 100/68 | HR 100 | Temp 98.7°F | Resp 16 | Ht 63.0 in | Wt 160.0 lb

## 2016-08-01 DIAGNOSIS — M9905 Segmental and somatic dysfunction of pelvic region: Secondary | ICD-10-CM | POA: Diagnosis not present

## 2016-08-01 DIAGNOSIS — M545 Low back pain: Secondary | ICD-10-CM | POA: Diagnosis not present

## 2016-08-01 DIAGNOSIS — M5137 Other intervertebral disc degeneration, lumbosacral region: Secondary | ICD-10-CM | POA: Diagnosis not present

## 2016-08-01 DIAGNOSIS — M9903 Segmental and somatic dysfunction of lumbar region: Secondary | ICD-10-CM | POA: Diagnosis not present

## 2016-08-01 MED ORDER — CYCLOBENZAPRINE HCL 10 MG PO TABS: 10.0000 mg | ORAL_TABLET | Freq: Three times a day (TID) | ORAL | 0 refills | Status: DC | PRN

## 2016-08-01 MED ORDER — PREDNISONE 20 MG PO TABS: ORAL_TABLET | ORAL | 0 refills | Status: DC

## 2016-08-01 NOTE — Progress Notes (Signed)
   Subjective:    Patient ID: Catherine Adams, female    DOB: [birth year], 41 y.o.   MRN: [id number]  HPI Patient injured the right side of her lower back last week while trying to groom a large dog. Pain is located in the superior right gluteus. There is no radiation. She denies any pain in the center of her back. She denies any sciatica-like symptoms. She denies any bowel or bladder incontinence. She denies any saddle anesthesia. She is seen a chiropractor twice with minimal relief. She is tried ibuprofen with no relief. Reflexes are normal today. Muscle strength is 5 over 5 equal and symmetric in the lower extremities. Flexion is limited to 45 due to pain. Patient can hyperextend 5 without pain Past Medical History:  Diagnosis Date  . Anxiety    Past Surgical History:  Procedure Laterality Date  . TUBAL LIGATION     Current Outpatient Prescriptions on File Prior to Visit  Medication Sig Dispense Refill  . ALPRAZolam (XANAX) 0.25 MG tablet TAKE 1 TABLET AT BEDTIME AS NEEDED 30 tablet 0  . ketoconazole (NIZORAL) 2 % shampoo     . triamcinolone (NASACORT ALLERGY 24HR) 55 MCG/ACT AERO nasal inhaler Place 2 sprays into the nose daily. 1 Inhaler 3   No current facility-administered medications on file prior to visit.    Allergies  Allergen Reactions  . Codeine     REACTION: N\T\V  . Monistat [Miconazole Nitrate-Wipes]    Social History   Social History  . Marital status: Married    Spouse name: N/A  . Number of children: N/A  . Years of education: N/A   Occupational History  . Not on file.   Social History Main Topics  . Smoking status: Former Games developermoker  . Smokeless tobacco: Never Used  . Alcohol use No  . Drug use: No  . Sexual activity: Yes    Birth control/ protection: Surgical   Other Topics Concern  . Not on file   Social History Narrative  . No narrative on file      Review of Systems  All other systems reviewed and are negative.      Objective:   Physical Exam  Cardiovascular: Normal rate, regular rhythm and normal heart sounds.   Pulmonary/Chest: Effort normal and breath sounds normal. No respiratory distress. She has no wheezes. She has no rales.  Musculoskeletal:       Lumbar back: She exhibits decreased range of motion, tenderness, pain and spasm. She exhibits no bony tenderness, no swelling and no deformity.       Back:  Vitals reviewed.         Assessment & Plan:  Acute right-sided low back pain, with sciatica presence unspecified - Plan: predniSONE (DELTASONE) 20 MG tablet, cyclobenzaprine (FLEXERIL) 10 MG tablet  I believe the symptoms are consistent with a muscle strain. I recommended a prednisone taper pack given the fact she has failed over-the-counter anti-inflammatories. Use Flexeril 10 mg every 8 hours as needed for muscle spasms. Recommended rest and no heavy lifting. Recommended moist heat 3 times a day. Recommended stretches. Reassess in one week if no better or sooner if worse

## 2016-08-08 DIAGNOSIS — M5137 Other intervertebral disc degeneration, lumbosacral region: Secondary | ICD-10-CM | POA: Diagnosis not present

## 2016-08-08 DIAGNOSIS — M9905 Segmental and somatic dysfunction of pelvic region: Secondary | ICD-10-CM | POA: Diagnosis not present

## 2016-08-08 DIAGNOSIS — M9903 Segmental and somatic dysfunction of lumbar region: Secondary | ICD-10-CM | POA: Diagnosis not present

## 2016-09-13 ENCOUNTER — Telehealth: Payer: Self-pay | Admitting: Physician Assistant

## 2016-09-13 NOTE — Telephone Encounter (Signed)
lvmtrc  

## 2016-09-13 NOTE — Telephone Encounter (Signed)
Pt states that she has a stye in her eye and wants to know if we can just call something in for her.

## 2016-09-13 NOTE — Telephone Encounter (Signed)
Spoke with patient she states she woke up this morning with a stye in her eye. Patient was instructed to apply war compresses on the eye for about 5-10 mins 2-4 time a day. Patient was instructed to call office back if no improvement within next next 3-5 days. Patient was also told she could use otc stye relief ointment as well.  Patient verbalizes understanding

## 2017-01-29 ENCOUNTER — Ambulatory Visit: Payer: Commercial Managed Care - HMO | Admitting: Family Medicine

## 2017-03-03 ENCOUNTER — Ambulatory Visit: Payer: 59 | Admitting: Family Medicine

## 2017-03-03 ENCOUNTER — Encounter: Payer: Self-pay | Admitting: Family Medicine

## 2017-03-03 ENCOUNTER — Other Ambulatory Visit: Payer: Self-pay

## 2017-03-03 VITALS — BP 110/68 | HR 78 | Temp 97.9°F | Resp 12 | Ht 63.0 in | Wt 167.0 lb

## 2017-03-03 DIAGNOSIS — F419 Anxiety disorder, unspecified: Secondary | ICD-10-CM

## 2017-03-03 DIAGNOSIS — N6001 Solitary cyst of right breast: Secondary | ICD-10-CM | POA: Diagnosis not present

## 2017-03-03 DIAGNOSIS — M545 Low back pain, unspecified: Secondary | ICD-10-CM

## 2017-03-03 MED ORDER — CYCLOBENZAPRINE HCL 10 MG PO TABS: 10.0000 mg | ORAL_TABLET | Freq: Three times a day (TID) | ORAL | 0 refills | Status: DC | PRN

## 2017-03-03 MED ORDER — ALPRAZOLAM 0.25 MG PO TABS: 0.2500 mg | ORAL_TABLET | Freq: Every evening | ORAL | 0 refills | Status: DC | PRN

## 2017-03-03 NOTE — Progress Notes (Signed)
   Subjective:    Patient ID: Catherine CordsBrandy S Adams, female    DOB: 08/08/1975, 42 y.o.   MRN: [id number]  Patient presents for Lower Back Pain (states that when she is on Ellipitcal she notes lower back pain) and L Breast Irritation (has bump like area to L breast- has been seen by derm for area and had I&D )  Right lower back pain, had during the summer after using ellipital, went to chiropracter which helpes Recently was on eliipital and had recurrent pain in right lower back, no radiating symptoms. Used heat and Ice and it finially camled down after a few days. She took 1 dose of Ibuprofen  Legs ache at night once she starts to rest. No movementments or twicthing, shen she stretches it out it goes away.   Left breast- small cyst like area, had had cut open twice, dermatology has lanced it [facility]  [facility] refill on xanax as needed, takes veyr rarely , brother on   Review Of Systems:  GEN- denies fatigue, fever, weight loss,weakness, recent illness HEENT- denies eye drainage, change in vision, nasal discharge, CVS- denies chest pain, palpitations RESP- denies SOB, cough, wheeze ABD- denies N/V, change in stools, abd pain GU- denies dysuria, hematuria, dribbling, incontinence MSK- + joint pain, muscle aches, injury Neuro- denies headache, dizziness, syncope, seizure activity       Objective:    BP 110/68   Pulse 78   Temp 97.9 F (36.6 C) (Oral)   Resp 12   Ht 5\' 3"  (1.6 m)   Wt 167 lb (75.8 kg)   SpO2 98%   BMI 29.58 kg/m  GEN- NAD, alert and oriented x3 HEENT- PERRL, EOMI, non injected sclera, pink conjunctiva, MMM, oropharynx clear Skin- left breast small cyst, NT, no erythema  CVS- RRR, no murmur RESP-CTAB ABD-NABS,soft,NT,ND Psych- normal affect and mood  MSK- Mild TTP Right paraspinals, neg SLR, good ROM spine Neuro- normal tone bilat, strength equal bilat  EXT- No edema Pulses- Radial 2+        Assessment & Plan:       Problem List Items Addressed This Visit      Unprioritized   Anxiety    Refilled xanax, rare use of medication She will call if feels she is needing daily or stresses worsen.      Relevant Medications   ALPRAZolam (XANAX) 0.25 MG tablet    Other Visit Diagnoses    Acute right-sided low back pain without sciatica    -  Primary   Improved ROM, discussed trying different exercise since ellipital seems to be trigger. Obtain records from chiropracter and XRAY images, no red flags on exam,flexeril refilled    Cyst of right breast       Recurrent cystic lesion will refer to to have removed completely      Note: This dictation was prepared with Dragon dictation along with smaller phrase technology. Any transcriptional errors that result from this process are unintentional.

## 2017-03-03 NOTE — Patient Instructions (Addendum)
Referral to surgeon  Use flexeril as needed  Release of records-  [facility] [address] , I need xray  Schedule for PHYSICAL- FASTING

## 2017-03-04 ENCOUNTER — Encounter: Payer: Self-pay | Admitting: Family Medicine

## 2017-03-04 NOTE — Assessment & Plan Note (Signed)
Refilled xanax, rare use of medication She will call if feels she is needing daily or stresses worsen.

## 2017-03-11 ENCOUNTER — Telehealth: Payer: Self-pay | Admitting: *Deleted

## 2017-03-11 NOTE — Telephone Encounter (Signed)
Received X-ray results.   MD reviewed and recommendations are as follows: Based on X-ray from chiropractor, she has mild DDD/ arthritis and mild scoliosis. Try the muscle relaxer and exercises as discussed.   Call placed to patient. LMTRC.

## 2017-03-13 NOTE — Telephone Encounter (Signed)
Call placed to patient. LMTRC.  

## 2017-03-13 NOTE — Telephone Encounter (Signed)
Patient returned call and made aware.

## 2017-04-03 DIAGNOSIS — N6082 Other benign mammary dysplasias of left breast: Secondary | ICD-10-CM | POA: Diagnosis not present

## 2017-04-17 ENCOUNTER — Other Ambulatory Visit: Payer: Self-pay | Admitting: General Surgery

## 2017-04-17 DIAGNOSIS — N6082 Other benign mammary dysplasias of left breast: Secondary | ICD-10-CM | POA: Diagnosis not present

## 2017-04-17 DIAGNOSIS — L720 Epidermal cyst: Secondary | ICD-10-CM | POA: Diagnosis not present

## 2017-04-25 ENCOUNTER — Other Ambulatory Visit: Payer: Self-pay

## 2017-04-25 ENCOUNTER — Encounter: Payer: Self-pay | Admitting: Family Medicine

## 2017-04-25 ENCOUNTER — Ambulatory Visit: Payer: 59 | Admitting: Family Medicine

## 2017-04-25 VITALS — BP 110/62 | HR 78 | Temp 98.7°F | Resp 14 | Ht 63.0 in | Wt 168.0 lb

## 2017-04-25 DIAGNOSIS — E010 Iodine-deficiency related diffuse (endemic) goiter: Secondary | ICD-10-CM

## 2017-04-25 LAB — T3, FREE: T3, Free: 3.1 pg/mL (ref 2.3–4.2)

## 2017-04-25 LAB — TSH: TSH: 0.73 m[IU]/L

## 2017-04-25 LAB — T4, FREE: FREE T4: 1.3 ng/dL (ref 0.8–1.8)

## 2017-04-25 NOTE — Progress Notes (Signed)
   Subjective:    Patient ID: Catherine Adams, female    DOB: [birth year], 42 y.o.   MRN: [id number]  Patient presents for Thyroid Check (was seen at dentist on Wed and was told that thyroid feels wife)   Was at the dentist and they hygienist told her her thyroid was enlarged. Now on that she thinks back sometimes feels like a lump is in her throat.  Dentist did not check    Review Of Systems:  GEN- denies fatigue, fever, weight loss,weakness, recent illness HEENT- denies eye drainage, change in vision, nasal discharge, CVS- denies chest pain, palpitations RESP- denies SOB, cough, wheeze ABD- denies N/V, change in stools, abd pain GU- denies dysuria, hematuria, dribbling, incontinence MSK- denies joint pain, muscle aches, injury Neuro- denies headache, dizziness, syncope, seizure activity       Objective:    BP 110/62   Pulse 78   Temp 98.7 F (37.1 C) (Oral)   Resp 14   Ht 5\' 3"  (1.6 m)   Wt 168 lb (76.2 kg)   SpO2 98%   BMI 29.76 kg/m  GEN- NAD, alert and oriented x3 HEENT- PERRL, EOMI, non injected sclera, pink conjunctiva, MMM, oropharynx clear Neck- Supple, mild thyroid prominence, no nodules  CVS- RRR, no murmur RESP-CTAB Pulses- Radial  2+        Assessment & Plan:      Problem List Items Addressed This Visit    None    Visit Diagnoses    Thyromegaly    -  Primary   possible mild enlarged size, no nodules, check TFT, send for US, she is very anxious about this now, no family history of thyroid cancer   Relevant Orders   TSH   T3, free   T4, free   US Soft Tissue Head/Neck      Note: This dictation was prepared with Dragon dictation along with smaller phrase technology. Any transcriptional errors that result from this process are unintentional.

## 2017-04-25 NOTE — Patient Instructions (Signed)
We will call with lab results Ultrasound  F/U as needed

## 2017-05-08 ENCOUNTER — Ambulatory Visit
Admission: RE | Admit: 2017-05-08 | Discharge: 2017-05-08 | Disposition: A | Discharge status: Home / Self Care | Payer: 59 | Source: Ambulatory Visit | Attending: Family Medicine | Admitting: Family Medicine

## 2017-05-08 DIAGNOSIS — E010 Iodine-deficiency related diffuse (endemic) goiter: Secondary | ICD-10-CM

## 2017-05-08 DIAGNOSIS — E041 Nontoxic single thyroid nodule: Secondary | ICD-10-CM | POA: Diagnosis not present

## 2017-05-27 ENCOUNTER — Ambulatory Visit (INDEPENDENT_AMBULATORY_CARE_PROVIDER_SITE_OTHER): Payer: 59 | Admitting: Family Medicine

## 2017-05-27 ENCOUNTER — Encounter: Payer: Self-pay | Admitting: Family Medicine

## 2017-05-27 ENCOUNTER — Other Ambulatory Visit: Payer: Self-pay

## 2017-05-27 VITALS — BP 110/64 | HR 62 | Temp 97.9°F | Resp 14 | Ht 63.0 in | Wt 165.0 lb

## 2017-05-27 DIAGNOSIS — Z0000 Encounter for general adult medical examination without abnormal findings: Secondary | ICD-10-CM | POA: Diagnosis not present

## 2017-05-27 DIAGNOSIS — K219 Gastro-esophageal reflux disease without esophagitis: Secondary | ICD-10-CM

## 2017-05-27 DIAGNOSIS — E041 Nontoxic single thyroid nodule: Secondary | ICD-10-CM | POA: Diagnosis not present

## 2017-05-27 MED ORDER — PANTOPRAZOLE SODIUM 40 MG PO TBEC: 40.0000 mg | DELAYED_RELEASE_TABLET | Freq: Every day | ORAL | 6 refills | Status: DC

## 2017-05-27 NOTE — Patient Instructions (Addendum)
I recommend eye visit once a year I recommend dental visit every 6 months Goal is to  Exercise 30 minutes 5 days a week We will call with lab results  F/U 1 year for Physical    

## 2017-05-27 NOTE — Progress Notes (Signed)
   Subjective:    Patient ID: Catherine Adams, female    DOB: [birth year], 42 y.o.   MRN: [id number]  Patient presents for CPE (is fasting) and GERD (reports increased reflux)  Pt here for CPE  Has been having more trouble with heartburn, has felt nausea with it. Used tums. Took a protoix and this helped Has tried omeprazole this made her have cramps    Dr. Huel CoteKathy Adams - Has appt in May, will have Mammogram done     Immunizations UTD  Due for fasting labs    Review Of Systems:  GEN- denies fatigue, fever, weight loss,weakness, recent illness HEENT- denies eye drainage, change in vision, nasal discharge, CVS- denies chest pain, palpitations RESP- denies SOB, cough, wheeze ABD- denies N/V, change in stools, abd pain GU- denies dysuria, hematuria, dribbling, incontinence MSK- denies joint pain, muscle aches, injury Neuro- denies headache, dizziness, syncope, seizure activity       Objective:    BP 110/64   Pulse 62   Temp 97.9 F (36.6 C) (Oral)   Resp 14   Ht 5\' 3"  (1.6 m)   Wt 165 lb (74.8 kg)   LMP 05/19/2017   SpO2 98%   BMI 29.23 kg/m  GEN- NAD, alert and oriented x3 HEENT- PERRL, EOMI, non injected sclera, pink conjunctiva, MMM, oropharynx clear Neck- Supple, no thyromegaly CVS- RRR, no murmur RESP-CTAB ABD-NABS,soft,NT,ND EXT- No edema Pulses- Radial, DP- 2+        Assessment & Plan:      Problem List Items Addressed This Visit      Unprioritized   Thyroid nodule    Incidental finding, very small, repeat US 1 year      GERD (gastroesophageal reflux disease)    Trial of protonix, monitor foods causing symptoms       Relevant Medications   pantoprazole (PROTONIX) 40 MG tablet    Other Visit Diagnoses    Routine general medical examination at a health care facility    -  Primary   CPE done, fasting labs, f/u GYN for mammogram and PAP , HIV screening done    Relevant Orders   CBC with Differential/Platelet   Comprehensive metabolic  panel   Lipid panel   HIV antibody      Note: This dictation was prepared with Dragon dictation along with smaller phrase technology. Any transcriptional errors that result from this process are unintentional.

## 2017-05-27 NOTE — Assessment & Plan Note (Signed)
Trial of protonix, monitor foods causing symptoms

## 2017-05-27 NOTE — Assessment & Plan Note (Signed)
Incidental finding, very small, repeat US 1 year

## 2017-05-28 ENCOUNTER — Encounter: Payer: Self-pay | Admitting: *Deleted

## 2017-05-28 LAB — COMPREHENSIVE METABOLIC PANEL
AG Ratio: 2.3 (calc) (ref 1.0–2.5)
ALBUMIN MSPROF: 4.5 g/dL (ref 3.6–5.1)
ALT: 10 U/L (ref 6–29)
AST: 11 U/L (ref 10–30)
Alkaline phosphatase (APISO): 48 U/L (ref 33–115)
BUN: 10 mg/dL (ref 7–25)
CO2: 27 mmol/L (ref 20–32)
Calcium: 9.5 mg/dL (ref 8.6–10.2)
Chloride: 106 mmol/L (ref 98–110)
Creat: 0.63 mg/dL (ref 0.50–1.10)
Globulin: 2 g/dL (calc) (ref 1.9–3.7)
Glucose, Bld: 100 mg/dL — ABNORMAL HIGH (ref 65–99)
POTASSIUM: 4.5 mmol/L (ref 3.5–5.3)
SODIUM: 139 mmol/L (ref 135–146)
TOTAL PROTEIN: 6.5 g/dL (ref 6.1–8.1)
Total Bilirubin: 0.4 mg/dL (ref 0.2–1.2)

## 2017-05-28 LAB — CBC WITH DIFFERENTIAL/PLATELET
Basophils Absolute: 31 {cells}/uL (ref 0–200)
Basophils Relative: 0.6 %
Eosinophils Absolute: 109 {cells}/uL (ref 15–500)
Eosinophils Relative: 2.1 %
HCT: 39.6 % (ref 35.0–45.0)
Hemoglobin: 13.6 g/dL (ref 11.7–15.5)
Lymphs Abs: 1758 {cells}/uL (ref 850–3900)
MCH: 30.3 pg (ref 27.0–33.0)
MCHC: 34.3 g/dL (ref 32.0–36.0)
MCV: 88.2 fL (ref 80.0–100.0)
MPV: 10.9 fL (ref 7.5–12.5)
Monocytes Relative: 10 %
Neutro Abs: 2782 {cells}/uL (ref 1500–7800)
Neutrophils Relative %: 53.5 %
Platelets: 239 10*3/uL (ref 140–400)
RBC: 4.49 Million/uL (ref 3.80–5.10)
RDW: 12 % (ref 11.0–15.0)
Total Lymphocyte: 33.8 %
WBC mixed population: 520 {cells}/uL (ref 200–950)
WBC: 5.2 10*3/uL (ref 3.8–10.8)

## 2017-05-28 LAB — LIPID PANEL
Cholesterol: 187 mg/dL (ref <200)
HDL: 53 mg/dL (ref >50)
LDL Cholesterol (Calc): 118 mg/dL (calc) — ABNORMAL HIGH
Non-HDL Cholesterol (Calc): 134 mg/dL (calc) — ABNORMAL HIGH (ref <130)
Total CHOL/HDL Ratio: 3.5 (calc) (ref <5.0)
Triglycerides: 72 mg/dL (ref <150)

## 2017-05-28 LAB — HIV ANTIBODY (ROUTINE TESTING W REFLEX): HIV: NONREACTIVE

## 2017-05-29 ENCOUNTER — Other Ambulatory Visit: Payer: Self-pay | Admitting: Family Medicine

## 2017-05-29 NOTE — Telephone Encounter (Signed)
Ok to refill??  Last office visit 05/27/2017.  Last refill 03/03/2017.

## 2017-07-07 DIAGNOSIS — Z1231 Encounter for screening mammogram for malignant neoplasm of breast: Secondary | ICD-10-CM | POA: Diagnosis not present

## 2017-07-07 DIAGNOSIS — Z130 Encounter for screening for diseases of the blood and blood-forming organs and certain disorders involving the immune mechanism: Secondary | ICD-10-CM | POA: Diagnosis not present

## 2017-07-07 DIAGNOSIS — Z01419 Encounter for gynecological examination (general) (routine) without abnormal findings: Secondary | ICD-10-CM | POA: Diagnosis not present

## 2017-07-07 DIAGNOSIS — Z6827 Body mass index (BMI) 27.0-27.9, adult: Secondary | ICD-10-CM | POA: Diagnosis not present

## 2017-08-09 ENCOUNTER — Other Ambulatory Visit: Payer: Self-pay | Admitting: Family Medicine

## 2017-08-09 ENCOUNTER — Other Ambulatory Visit: Payer: Self-pay | Admitting: Physician Assistant

## 2017-08-11 NOTE — Telephone Encounter (Signed)
Ok to refill 

## 2017-08-11 NOTE — Telephone Encounter (Signed)
Last OV 05/27/2017 Last refill 05/29/2017 Ok to refill?

## 2017-10-21 ENCOUNTER — Encounter (HOSPITAL_COMMUNITY): Payer: Self-pay | Admitting: Emergency Medicine

## 2017-10-21 ENCOUNTER — Ambulatory Visit (HOSPITAL_COMMUNITY)
Admission: EM | Admit: 2017-10-21 | Discharge: 2017-10-21 | Disposition: A | Discharge status: Home / Self Care | Payer: 59 | Attending: Family Medicine | Admitting: Family Medicine

## 2017-10-21 ENCOUNTER — Ambulatory Visit (INDEPENDENT_AMBULATORY_CARE_PROVIDER_SITE_OTHER): Payer: 59

## 2017-10-21 ENCOUNTER — Ambulatory Visit (HOSPITAL_COMMUNITY): Payer: 59

## 2017-10-21 DIAGNOSIS — M79642 Pain in left hand: Secondary | ICD-10-CM

## 2017-10-21 DIAGNOSIS — M25511 Pain in right shoulder: Secondary | ICD-10-CM

## 2017-10-21 DIAGNOSIS — S4991XA Unspecified injury of right shoulder and upper arm, initial encounter: Secondary | ICD-10-CM | POA: Diagnosis not present

## 2017-10-21 MED ORDER — CYCLOBENZAPRINE HCL 10 MG PO TABS: 10.0000 mg | ORAL_TABLET | Freq: Two times a day (BID) | ORAL | 0 refills | Status: DC | PRN

## 2017-10-21 MED ORDER — IBUPROFEN 800 MG PO TABS
ORAL_TABLET | ORAL | Status: AC
  Filled 2017-10-21: qty 1

## 2017-10-21 MED ORDER — IBUPROFEN 800 MG PO TABS
800.0000 mg | ORAL_TABLET | Freq: Once | ORAL | Status: AC
  Administered 2017-10-21: 800 mg via ORAL

## 2017-10-21 MED ORDER — NAPROXEN 500 MG PO TABS: 500.0000 mg | ORAL_TABLET | Freq: Two times a day (BID) | ORAL | 0 refills | Status: DC
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
## 2017-10-21 NOTE — ED Notes (Signed)
UPT not needed prior to XR, PT has had tubal ligation

## 2017-10-22 DIAGNOSIS — S62643A Nondisplaced fracture of proximal phalanx of left middle finger, initial encounter for closed fracture: Secondary | ICD-10-CM | POA: Diagnosis not present

## 2017-10-22 DIAGNOSIS — M25511 Pain in right shoulder: Secondary | ICD-10-CM | POA: Diagnosis not present
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
## 2017-10-29 DIAGNOSIS — M25511 Pain in right shoulder: Secondary | ICD-10-CM | POA: Diagnosis not present

## 2017-10-29 DIAGNOSIS — M25562 Pain in left knee: Secondary | ICD-10-CM | POA: Diagnosis not present

## 2017-10-29 DIAGNOSIS — S62643D Nondisplaced fracture of proximal phalanx of left middle finger, subsequent encounter for fracture with routine healing: Secondary | ICD-10-CM | POA: Diagnosis not present

## 2017-11-05 DIAGNOSIS — S62643D Nondisplaced fracture of proximal phalanx of left middle finger, subsequent encounter for fracture with routine healing: Secondary | ICD-10-CM | POA: Diagnosis not present

## 2017-11-05 DIAGNOSIS — M25532 Pain in left wrist: Secondary | ICD-10-CM | POA: Diagnosis not present

## 2017-11-05 DIAGNOSIS — M25511 Pain in right shoulder: Secondary | ICD-10-CM | POA: Diagnosis not present

## 2017-11-11 DIAGNOSIS — M25511 Pain in right shoulder: Secondary | ICD-10-CM | POA: Diagnosis not present

## 2017-11-11 DIAGNOSIS — M25532 Pain in left wrist: Secondary | ICD-10-CM | POA: Diagnosis not present

## 2017-11-12 DIAGNOSIS — M25532 Pain in left wrist: Secondary | ICD-10-CM | POA: Diagnosis not present

## 2017-11-12 DIAGNOSIS — M25511 Pain in right shoulder: Secondary | ICD-10-CM | POA: Diagnosis not present

## 2017-12-03 DIAGNOSIS — S62643D Nondisplaced fracture of proximal phalanx of left middle finger, subsequent encounter for fracture with routine healing: Secondary | ICD-10-CM | POA: Diagnosis not present

## 2017-12-03 DIAGNOSIS — M79645 Pain in left finger(s): Secondary | ICD-10-CM | POA: Diagnosis not present

## 2017-12-03 DIAGNOSIS — M25511 Pain in right shoulder: Secondary | ICD-10-CM | POA: Diagnosis not present

## 2018-01-07 DIAGNOSIS — M25511 Pain in right shoulder: Secondary | ICD-10-CM | POA: Diagnosis not present

## 2018-02-04 ENCOUNTER — Other Ambulatory Visit: Payer: Self-pay | Admitting: *Deleted

## 2018-02-04 MED ORDER — ALPRAZOLAM 0.25 MG PO TABS: 0.2500 mg | ORAL_TABLET | Freq: Every evening | ORAL | 1 refills | Status: DC | PRN

## 2018-02-04 NOTE — Telephone Encounter (Signed)
Received fax requesting refill on Xanax.   Ok to refill??  Last office visit 05/27/2017.  Last refill 08/11/2017.

## 2018-02-19 DIAGNOSIS — M25511 Pain in right shoulder: Secondary | ICD-10-CM | POA: Diagnosis not present

## 2018-02-19 DIAGNOSIS — M25611 Stiffness of right shoulder, not elsewhere classified: Secondary | ICD-10-CM | POA: Diagnosis not present

## 2018-02-19 DIAGNOSIS — M6281 Muscle weakness (generalized): Secondary | ICD-10-CM | POA: Diagnosis not present

## 2018-02-23 DIAGNOSIS — M25511 Pain in right shoulder: Secondary | ICD-10-CM | POA: Diagnosis not present

## 2018-03-19 DIAGNOSIS — I83813 Varicose veins of bilateral lower extremities with pain: Secondary | ICD-10-CM | POA: Diagnosis not present

## 2018-03-25 DIAGNOSIS — M25511 Pain in right shoulder: Secondary | ICD-10-CM | POA: Diagnosis not present

## 2018-04-14 DIAGNOSIS — I83813 Varicose veins of bilateral lower extremities with pain: Secondary | ICD-10-CM | POA: Diagnosis not present

## 2018-04-15 ENCOUNTER — Other Ambulatory Visit: Payer: Self-pay | Admitting: Family Medicine

## 2018-04-17 ENCOUNTER — Encounter: Payer: Self-pay | Admitting: Family Medicine

## 2018-04-17 ENCOUNTER — Ambulatory Visit: Payer: 59 | Admitting: Family Medicine

## 2018-04-17 VITALS — BP 110/72 | HR 90 | Temp 98.5°F | Resp 16 | Wt 165.1 lb

## 2018-04-17 DIAGNOSIS — J069 Acute upper respiratory infection, unspecified: Secondary | ICD-10-CM | POA: Diagnosis not present

## 2018-04-17 MED ORDER — AMOXICILLIN-POT CLAVULANATE 875-125 MG PO TABS: 1.0000 | ORAL_TABLET | Freq: Two times a day (BID) | ORAL | 0 refills | Status: AC

## 2018-04-17 MED ORDER — FLUCONAZOLE 150 MG PO TABS: 150.0000 mg | ORAL_TABLET | ORAL | 1 refills | Status: DC | PRN

## 2018-04-17 NOTE — Patient Instructions (Signed)
Start with a daily allegra or claritin, and add on flonase or nasonex   Start the antibiotics only with severe suddenly worsening sinus/facial pain    Upper Respiratory Infection, Adult An upper respiratory infection (URI) affects the nose, throat, and upper air passages. URIs are caused by germs (viruses). The most common type of URI is often called "the common cold." Medicines cannot cure URIs, but you can do things at home to relieve your symptoms. URIs usually get better within 7-10 days. Follow these instructions at home: Activity  Rest as needed.  If you have a fever, stay home from work or school until your fever is gone, or until your doctor says you may return to work or school. ? You should stay home until you cannot spread the infection anymore (you are not contagious). ? Your doctor may have you wear a face mask so you have less risk of spreading the infection. Relieving symptoms  Gargle with a salt-water mixture 3-4 times a day or as needed. To make a salt-water mixture, completely dissolve -1 tsp of salt in 1 cup of warm water.  Use a cool-mist humidifier to add moisture to the air. This can help you breathe more easily. Eating and drinking   Drink enough fluid to keep your pee (urine) pale yellow.  Eat soups and other clear broths. General instructions   Take over-the-counter and prescription medicines only as told by your doctor. These include cold medicines, fever reducers, and cough suppressants.  Do not use any products that contain nicotine or tobacco. These include cigarettes and e-cigarettes. If you need help quitting, ask your doctor.  Avoid being where people are smoking (avoid secondhand smoke).  Make sure you get regular shots and get the flu shot every year.  Keep all follow-up visits as told by your doctor. This is important. How to avoid spreading infection to others   Wash your hands often with soap and water. If you do not have soap and water,  use hand sanitizer.  Avoid touching your mouth, face, eyes, or nose.  Cough or sneeze into a tissue or your sleeve or elbow. Do not cough or sneeze into your hand or into the air. Contact a doctor if:  You are getting worse, not better.  You have any of these: ? A fever. ? Chills. ? Brown or red mucus in your nose. ? Yellow or brown fluid (discharge)coming from your nose. ? Pain in your face, especially when you bend forward. ? Swollen neck glands. ? Pain with swallowing. ? White areas in the back of your throat. Get help right away if:  You have shortness of breath that gets worse.  You have very bad or constant: ? Headache. ? Ear pain. ? Pain in your forehead, behind your eyes, and over your cheekbones (sinus pain). ? Chest pain.  You have long-lasting (chronic) lung disease along with any of these: ? Wheezing. ? Long-lasting cough. ? Coughing up blood. ? A change in your usual mucus.  You have a stiff neck.  You have changes in your: ? Vision. ? Hearing. ? Thinking. ? Mood. Summary  An upper respiratory infection (URI) is caused by a germ called a virus. The most common type of URI is often called "the common cold."  URIs usually get better within 7-10 days.  Take over-the-counter and prescription medicines only as told by your doctor. This information is not intended to replace advice given to you by your health care provider. Make sure  you discuss any questions you have with your health care provider. Document Released: 07/24/2007 Document Revised: 09/27/2016 Document Reviewed: 09/27/2016 Elsevier Interactive Patient Education  2019 [employer].

## 2018-04-17 NOTE — Progress Notes (Signed)
Patient ID: Catherine Adams, female    DOB: 04/19/1975, 43 y.o.   MRN: [id number]  PCP: Salley Scarlet, MD  Chief Complaint  Patient presents with  . Sinusitis    Patient in with c/o ear pain, sore throat, sneezing, sinus pressure. Onset Saturday     Subjective:   Catherine Adams is a 43 y.o. female, presents to clinic with CC of URI, sore throat ear pain x 6 days Sinusitis  This is a new problem. The current episode started in the past 7 days. The problem has been gradually worsening since onset. There has been no fever. Associated symptoms include chills, congestion, coughing, ear pain, headaches, a hoarse voice, sinus pressure, sneezing and a sore throat. Pertinent negatives include no diaphoresis, shortness of breath or swollen glands.   She has treated with dayquil and nyquil without much improvement.     Patient Active Problem List   Diagnosis Date Noted  . GERD (gastroesophageal reflux disease) 05/27/2017  . Thyroid nodule 05/27/2017  . Abdominal pain, unspecified site 07/29/2012  . Anxiety      Prior to Admission medications   Medication Sig Start Date End Date Taking? Authorizing Provider  ALPRAZolam (XANAX) 0.25 MG tablet Take 1 tablet (0.25 mg total) by mouth at bedtime as needed. 02/04/18  Yes [place], Velna Hatchet, MD  pantoprazole (PROTONIX) 40 MG tablet TAKE 1 TABLET BY MOUTH EVERY DAY 04/15/18  Yes [place], Velna Hatchet, MD  cyclobenzaprine (FLEXERIL) 10 MG tablet Take 1 tablet (10 mg total) by mouth 2 (two) times daily as needed for muscle spasms. Patient not taking: Reported on 04/17/2018 10/21/17   Dahlia Byes A, NP  naproxen (NAPROSYN) 500 MG tablet Take 1 tablet (500 mg total) by mouth 2 (two) times daily. Patient not taking: Reported on 04/17/2018 10/21/17   Janace Aris, NP     Allergies  Allergen Reactions  . Codeine     REACTION: N\T\V  . Monistat [Miconazole Nitrate-Wipes]      Family History  Problem Relation Age of Onset  . Heart disease Father  60       CAD  . Drug abuse Brother   . Cancer Maternal Grandmother        breast cancer      Social History   Socioeconomic History  . Marital status: Married    Spouse name: Not on file  . Number of children: Not on file  . Years of education: Not on file  . Highest education level: Not on file  Occupational History  . Not on file  Social Needs  . Financial resource strain: Not on file  . Food insecurity:    Worry: Not on file    Inability: Not on file  . Transportation needs:    Medical: Not on file    Non-medical: Not on file  Tobacco Use  . Smoking status: Former Games developer  . Smokeless tobacco: Never Used  Substance and Sexual Activity  . Alcohol use: No  . Drug use: No  . Sexual activity: Yes    Birth control/protection: Surgical  Lifestyle  . Physical activity:    Days per week: Not on file    Minutes per session: Not on file  . Stress: Not on file  Relationships  . Social connections:    Talks on phone: Not on file    Gets together: Not on file    Attends religious service: Not on file    Active member of club  or organization: Not on file    Attends meetings of clubs or organizations: Not on file    Relationship status: Not on file  . Intimate partner violence:    Fear of current or ex partner: Not on file    Emotionally abused: Not on file    Physically abused: Not on file    Forced sexual activity: Not on file  Other Topics Concern  . Not on file  Social History Narrative  . Not on file     Review of Systems  Constitutional: Positive for chills. Negative for diaphoresis.  HENT: Positive for congestion, ear pain, hoarse voice, sinus pressure, sneezing and sore throat.   Eyes: Negative.   Respiratory: Positive for cough. Negative for shortness of breath.   Cardiovascular: Negative.   Gastrointestinal: Negative.   Endocrine: Negative.   Genitourinary: Negative.   Musculoskeletal: Negative.   Skin: Negative.   Allergic/Immunologic: Negative.     Neurological: Positive for headaches.  Hematological: Negative.   Psychiatric/Behavioral: Negative.   All other systems reviewed and are negative.      Objective:    Vitals:   04/17/18 0904  BP: 110/72  Pulse: 90  Resp: 16  Temp: 98.5 F (36.9 C)  TempSrc: Oral  SpO2: 98%  Weight: 165 lb 2 oz (74.9 kg)      Physical Exam Vitals signs and nursing note reviewed.  Constitutional:      General: She is not in acute distress.    Appearance: She is well-developed. She is not ill-appearing, toxic-appearing or diaphoretic.  HENT:     Head: Normocephalic and atraumatic.     Right Ear: Hearing, tympanic membrane, ear canal and external ear normal.     Left Ear: Hearing, tympanic membrane, ear canal and external ear normal.     Nose: Mucosal edema, congestion and rhinorrhea present.     Right Sinus: No maxillary sinus tenderness or frontal sinus tenderness.     Left Sinus: No maxillary sinus tenderness or frontal sinus tenderness.     Mouth/Throat:     Mouth: Mucous membranes are moist. Mucous membranes are not pale.     Pharynx: Uvula midline. Posterior oropharyngeal erythema present. No oropharyngeal exudate or uvula swelling.     Tonsils: No tonsillar abscesses.  Eyes:     General:        Right eye: No discharge.        Left eye: No discharge.     Conjunctiva/sclera: Conjunctivae normal.     Pupils: Pupils are equal, round, and reactive to light.  Neck:     Musculoskeletal: Normal range of motion and neck supple.     Trachea: No tracheal deviation.  Cardiovascular:     Rate and Rhythm: Normal rate and regular rhythm.     Pulses: Normal pulses.     Heart sounds: Normal heart sounds. No murmur. No friction rub. No gallop.   Pulmonary:     Effort: Pulmonary effort is normal. No respiratory distress.     Breath sounds: Normal breath sounds. No stridor. No wheezing, rhonchi or rales.  Abdominal:     General: Bowel sounds are normal. There is no distension.     Palpations:  Abdomen is soft.     Tenderness: There is no abdominal tenderness. There is no guarding or rebound.  Musculoskeletal: Normal range of motion.     Right lower leg: No edema.     Left lower leg: No edema.  Skin:    General: Skin is  warm and dry.     Capillary Refill: Capillary refill takes less than 2 seconds.     Coloration: Skin is not pale.     Findings: No rash.  Neurological:     Mental Status: She is alert.     Motor: No abnormal muscle tone.     Coordination: Coordination normal.  Psychiatric:        Behavior: Behavior normal.           Assessment & Plan:   Pt with URI sx, vital signs stable, afebrile, no signs of bacterial sinusitis or pneumonia, suspect viral illness, treatment supportive and symptomatic.  Suspect there may be some underlying allergic rhinitis, have encouraged patient to start daily Allegra or Claritin and add on a steroid nasal spray.  Due to the duration of symptoms and today being a Friday, patient was given printed prescription for antibiotics and Diflucan with clear instructions to only fill and start taking if she has acute sudden and severe worsening of sinus pain and pressure with fever or continued moderate to severe symptoms beyond 8 to 10 days.   ICD-10-CM   1. Upper respiratory tract infection, unspecified type J06.9        Danelle Berry, PA-C 04/17/18 9:22 AM

## 2018-04-21 DIAGNOSIS — I83012 Varicose veins of right lower extremity with ulcer of calf: Secondary | ICD-10-CM | POA: Diagnosis not present

## 2018-04-21 DIAGNOSIS — G2581 Restless legs syndrome: Secondary | ICD-10-CM | POA: Diagnosis not present

## 2018-04-22 ENCOUNTER — Encounter: Payer: Self-pay | Admitting: Family Medicine

## 2018-04-24 DIAGNOSIS — S40011D Contusion of right shoulder, subsequent encounter: Secondary | ICD-10-CM | POA: Diagnosis not present

## 2018-06-10 DIAGNOSIS — S40011D Contusion of right shoulder, subsequent encounter: Secondary | ICD-10-CM | POA: Diagnosis not present

## 2018-07-02 ENCOUNTER — Other Ambulatory Visit: Payer: Self-pay

## 2018-07-02 ENCOUNTER — Other Ambulatory Visit: Payer: 59

## 2018-07-02 DIAGNOSIS — Z0000 Encounter for general adult medical examination without abnormal findings: Secondary | ICD-10-CM | POA: Diagnosis not present

## 2018-07-03 LAB — CBC WITH DIFFERENTIAL/PLATELET
Absolute Monocytes: 567 cells/uL (ref 200–950)
Basophils Absolute: 49 cells/uL (ref 0–200)
Basophils Relative: 0.8 %
Eosinophils Absolute: 146 cells/uL (ref 15–500)
Eosinophils Relative: 2.4 %
HCT: 39.4 % (ref 35.0–45.0)
Hemoglobin: 13.5 g/dL (ref 11.7–15.5)
Lymphs Abs: 2031 cells/uL (ref 850–3900)
MCH: 30.8 pg (ref 27.0–33.0)
MCHC: 34.3 g/dL (ref 32.0–36.0)
MCV: 89.7 fL (ref 80.0–100.0)
MPV: 10.9 fL (ref 7.5–12.5)
Monocytes Relative: 9.3 %
Neutro Abs: 3306 cells/uL (ref 1500–7800)
Neutrophils Relative %: 54.2 %
Platelets: 253 10*3/uL (ref 140–400)
RBC: 4.39 10*6/uL (ref 3.80–5.10)
RDW: 12.2 % (ref 11.0–15.0)
Total Lymphocyte: 33.3 %
WBC: 6.1 10*3/uL (ref 3.8–10.8)

## 2018-07-03 LAB — COMPREHENSIVE METABOLIC PANEL
AG Ratio: 1.9 (calc) (ref 1.0–2.5)
ALT: 12 U/L (ref 6–29)
AST: 13 U/L (ref 10–30)
Albumin: 4.2 g/dL (ref 3.6–5.1)
Alkaline phosphatase (APISO): 46 U/L (ref 31–125)
BUN: 13 mg/dL (ref 7–25)
CO2: 24 mmol/L (ref 20–32)
Calcium: 8.9 mg/dL (ref 8.6–10.2)
Chloride: 107 mmol/L (ref 98–110)
Creat: 0.75 mg/dL (ref 0.50–1.10)
Globulin: 2.2 g/dL (calc) (ref 1.9–3.7)
Glucose, Bld: 95 mg/dL (ref 65–99)
Potassium: 4.4 mmol/L (ref 3.5–5.3)
Sodium: 139 mmol/L (ref 135–146)
Total Bilirubin: 0.4 mg/dL (ref 0.2–1.2)
Total Protein: 6.4 g/dL (ref 6.1–8.1)

## 2018-07-03 LAB — LIPID PANEL
Cholesterol: 196 mg/dL (ref <200)
HDL: 60 mg/dL (ref >=50)
LDL Cholesterol (Calc): 121 mg/dL (calc) — ABNORMAL HIGH
Non-HDL Cholesterol (Calc): 136 mg/dL (calc) — ABNORMAL HIGH (ref <130)
Total CHOL/HDL Ratio: 3.3 (calc) (ref <5.0)
Triglycerides: 63 mg/dL (ref <150)

## 2018-07-07 ENCOUNTER — Encounter: Payer: Self-pay | Admitting: Family Medicine

## 2018-07-07 ENCOUNTER — Other Ambulatory Visit: Payer: Self-pay

## 2018-07-07 ENCOUNTER — Ambulatory Visit (INDEPENDENT_AMBULATORY_CARE_PROVIDER_SITE_OTHER): Payer: 59 | Admitting: Family Medicine

## 2018-07-07 VITALS — BP 110/78 | HR 84 | Temp 98.2°F | Resp 16 | Ht 62.0 in | Wt 171.6 lb

## 2018-07-07 DIAGNOSIS — Z0001 Encounter for general adult medical examination with abnormal findings: Secondary | ICD-10-CM | POA: Diagnosis not present

## 2018-07-07 DIAGNOSIS — E785 Hyperlipidemia, unspecified: Secondary | ICD-10-CM

## 2018-07-07 DIAGNOSIS — E041 Nontoxic single thyroid nodule: Secondary | ICD-10-CM | POA: Diagnosis not present

## 2018-07-07 DIAGNOSIS — Z0000 Encounter for general adult medical examination without abnormal findings: Secondary | ICD-10-CM

## 2018-07-07 NOTE — Assessment & Plan Note (Signed)
Recheck thyroid labs Per pt no change to thyroid/anterior neck

## 2018-07-07 NOTE — Assessment & Plan Note (Signed)
Labs reviewed with pt, high LDL  Diet and exercise recommended - AHA HLD handout and diet instructions given and reviewed  ASCVD risk calculator - risk very low - 0.4%, tx with lifestyle changes F/up with next routine visit with PCP

## 2018-07-07 NOTE — Patient Instructions (Addendum)
Health Maintenance, Female Adopting a healthy lifestyle and getting preventive care can go a long way to promote health and wellness. Talk with your health care provider about what schedule of regular examinations is right for you. This is a good chance for you to check in with your provider about disease prevention and staying healthy. In between checkups, there are plenty of things you can do on your own. Experts have done a lot of research about which lifestyle changes and preventive measures are most likely to keep you healthy. Ask your health care provider for more information. Weight and diet Eat a healthy diet  Be sure to include plenty of vegetables, fruits, low-fat dairy products, and lean protein.  Do not eat a lot of foods high in solid fats, added sugars, or salt.  Get regular exercise. This is one of the most important things you can do for your health. ? Most adults should exercise for at least 150 minutes each week. The exercise should increase your heart rate and make you sweat (moderate-intensity exercise). ? Most adults should also do strengthening exercises at least twice a week. This is in addition to the moderate-intensity exercise. Maintain a healthy weight  Body mass index (BMI) is a measurement that can be used to identify possible weight problems. It estimates body fat based on height and weight. Your health care provider can help determine your BMI and help you achieve or maintain a healthy weight.  For females 5 years of age and older: ? A BMI below 18.5 is considered underweight. ? A BMI of 18.5 to 24.9 is normal. ? A BMI of 25 to 29.9 is considered overweight. ? A BMI of 30 and above is considered obese. Watch levels of cholesterol and blood lipids  You should start having your blood tested for lipids and cholesterol at 42 years of age, then have this test every 5 years.  You may need to have your cholesterol levels checked more often if: ? Your lipid or  cholesterol levels are high. ? You are older than 42 years of age. ? You are at high risk for heart disease. Cancer screening Lung Cancer  Lung cancer screening is recommended for adults 48-79 years old who are at high risk for lung cancer because of a history of smoking.  A yearly low-dose CT scan of the lungs is recommended for people who: ? Currently smoke. ? Have quit within the past 15 years. ? Have at least a 30-pack-year history of smoking. A pack year is smoking an average of one pack of cigarettes a day for 1 year.  Yearly screening should continue until it has been 15 years since you quit.  Yearly screening should stop if you develop a health problem that would prevent you from having lung cancer treatment. Breast Cancer  Practice breast self-awareness. This means understanding how your breasts normally appear and feel.  It also means doing regular breast self-exams. Let your health care provider know about any changes, no matter how small.  If you are in your 20s or 30s, you should have a clinical breast exam (CBE) by a health care provider every 1-3 years as part of a regular health exam.  If you are 22 or older, have a CBE every year. Also consider having a breast X-ray (mammogram) every year.  If you have a family history of breast cancer, talk to your health care provider about genetic screening.  If you are at high risk for breast cancer, talk  to your health care provider about having an MRI and a mammogram every year.  Breast cancer gene (BRCA) assessment is recommended for women who have family members with BRCA-related cancers. BRCA-related cancers include: ? Breast. ? Ovarian. ? Tubal. ? Peritoneal cancers.  Results of the assessment will determine the need for genetic counseling and BRCA1 and BRCA2 testing. Cervical Cancer Your health care provider may recommend that you be screened regularly for cancer of the pelvic organs (ovaries, uterus, and vagina).  This screening involves a pelvic examination, including checking for microscopic changes to the surface of your cervix (Pap test). You may be encouraged to have this screening done every 3 years, beginning at age 21.  For women ages 30-65, health care providers may recommend pelvic exams and Pap testing every 3 years, or they may recommend the Pap and pelvic exam, combined with testing for human papilloma virus (HPV), every 5 years. Some types of HPV increase your risk of cervical cancer. Testing for HPV may also be done on women of any age with unclear Pap test results.  Other health care providers may not recommend any screening for nonpregnant women who are considered low risk for pelvic cancer and who do not have symptoms. Ask your health care provider if a screening pelvic exam is right for you.  If you have had past treatment for cervical cancer or a condition that could lead to cancer, you need Pap tests and screening for cancer for at least 20 years after your treatment. If Pap tests have been discontinued, your risk factors (such as having a new sexual partner) need to be reassessed to determine if screening should resume. Some women have medical problems that increase the chance of getting cervical cancer. In these cases, your health care provider may recommend more frequent screening and Pap tests. Colorectal Cancer  This type of cancer can be detected and often prevented.  Routine colorectal cancer screening usually begins at 42 years of age and continues through 42 years of age.  Your health care provider may recommend screening at an earlier age if you have risk factors for colon cancer.  Your health care provider may also recommend using home test kits to check for hidden blood in the stool.  A small camera at the end of a tube can be used to examine your colon directly (sigmoidoscopy or colonoscopy). This is done to check for the earliest forms of colorectal cancer.  Routine  screening usually begins at age 50.  Direct examination of the colon should be repeated every 5-10 years through 42 years of age. However, you may need to be screened more often if early forms of precancerous polyps or small growths are found. Skin Cancer  Check your skin from head to toe regularly.  Tell your health care provider about any new moles or changes in moles, especially if there is a change in a mole's shape or color.  Also tell your health care provider if you have a mole that is larger than the size of a pencil eraser.  Always use sunscreen. Apply sunscreen liberally and repeatedly throughout the day.  Protect yourself by wearing long sleeves, pants, a wide-brimmed hat, and sunglasses whenever you are outside. Heart disease, diabetes, and high blood pressure  High blood pressure causes heart disease and increases the risk of stroke. High blood pressure is more likely to develop in: ? People who have blood pressure in the high end of the normal range (130-139/85-89 mm Hg). ? People   who are overweight or obese. ? People who are African American.  If you are 84-22 years of age, have your blood pressure checked every 3-5 years. If you are 67 years of age or older, have your blood pressure checked every year. You should have your blood pressure measured twice-once when you are at a hospital or clinic, and once when you are not at a hospital or clinic. Record the average of the two measurements. To check your blood pressure when you are not at a hospital or clinic, you can use: ? An automated blood pressure machine at a pharmacy. ? A home blood pressure monitor.  If you are between 52 years and 3 years old, ask your health care provider if you should take aspirin to prevent strokes.  Have regular diabetes screenings. This involves taking a blood sample to check your fasting blood sugar level. ? If you are at a normal weight and have a low risk for diabetes, have this test once  every three years after 42 years of age. ? If you are overweight and have a high risk for diabetes, consider being tested at a younger age or more often. Preventing infection Hepatitis B  If you have a higher risk for hepatitis B, you should be screened for this virus. You are considered at high risk for hepatitis B if: ? You were born in a country where hepatitis B is common. Ask your health care provider which countries are considered high risk. ? Your parents were born in a high-risk country, and you have not been immunized against hepatitis B (hepatitis B vaccine). ? You have HIV or AIDS. ? You use needles to inject street drugs. ? You live with someone who has hepatitis B. ? You have had sex with someone who has hepatitis B. ? You get hemodialysis treatment. ? You take certain medicines for conditions, including cancer, organ transplantation, and autoimmune conditions. Hepatitis C  Blood testing is recommended for: ? Everyone born from [birth year] through 1964. ? Anyone with known risk factors for hepatitis C. Sexually transmitted infections (STIs)  You should be screened for sexually transmitted infections (STIs) including gonorrhea and chlamydia if: ? You are sexually active and are younger than 42 years of age. ? You are older than 42 years of age and your health care provider tells you that you are at risk for this type of infection. ? Your sexual activity has changed since you were last screened and you are at an increased risk for chlamydia or gonorrhea. Ask your health care provider if you are at risk.  If you do not have HIV, but are at risk, it may be recommended that you take a prescription medicine daily to prevent HIV infection. This is called pre-exposure prophylaxis (PrEP). You are considered at risk if: ? You are sexually active and do not regularly use condoms or know the HIV status of your partner(s). ? You take drugs by injection. ? You are sexually active with a partner  who has HIV. Talk with your health care provider about whether you are at high risk of being infected with HIV. If you choose to begin PrEP, you should first be tested for HIV. You should then be tested every 3 months for as long as you are taking PrEP. Pregnancy  If you are premenopausal and you may become pregnant, ask your health care provider about preconception counseling.  If you may become pregnant, take 400 to 800 micrograms (mcg) of folic acid every  day.  If you want to prevent pregnancy, talk to your health care provider about birth control (contraception). Osteoporosis and menopause  Osteoporosis is a disease in which the bones lose minerals and strength with aging. This can result in serious bone fractures. Your risk for osteoporosis can be identified using a bone density scan.  If you are 83 years of age or older, or if you are at risk for osteoporosis and fractures, ask your health care provider if you should be screened.  Ask your health care provider whether you should take a calcium or vitamin D supplement to lower your risk for osteoporosis.  Menopause may have certain physical symptoms and risks.  Hormone replacement therapy may reduce some of these symptoms and risks. Talk to your health care provider about whether hormone replacement therapy is right for you. Follow these instructions at home:  Schedule regular health, dental, and eye exams.  Stay current with your immunizations.  Do not use any tobacco products including cigarettes, chewing tobacco, or electronic cigarettes.  If you are pregnant, do not drink alcohol.  If you are breastfeeding, limit how much and how often you drink alcohol.  Limit alcohol intake to no more than 1 drink per day for nonpregnant women. One drink equals 12 ounces of beer, 5 ounces of wine, or 1 ounces of hard liquor.  Do not use street drugs.  Do not share needles.  Ask your health care provider for help if you need support  or information about quitting drugs.  Tell your health care provider if you often feel depressed.  Tell your health care provider if you have ever been abused or do not feel safe at home. This information is not intended to replace advice given to you by your health care provider. Make sure you discuss any questions you have with your health care provider. Document Released: 08/20/2010 Document Revised: 07/13/2015 Document Reviewed: 11/08/2014 Elsevier Interactive Patient Education  2019 [place] [facility]) Exercise Recommendation  Being physically active is important to prevent heart disease and stroke, the nation's No. 1and No. 5killers. To improve overall cardiovascular health, we suggest at least 150 minutes per week of moderate exercise or 75 minutes per week of vigorous exercise (or a combination of moderate and vigorous activity). Thirty minutes a day, five times a week is an easy goal to remember. You will also experience benefits even if you divide your time into two or three segments of 10 to 15 minutes per day.  For people who would benefit from lowering their blood pressure or cholesterol, we recommend 40 minutes of aerobic exercise of moderate to vigorous intensity three to four times a week to lower the risk for heart attack and stroke.  Physical activity is anything that makes you move your body and burn calories.  This includes things like climbing stairs or playing sports. Aerobic exercises benefit your heart, and include walking, jogging, swimming or biking. Strength and stretching exercises are best for overall stamina and flexibility.  The simplest, positive change you can make to effectively improve your heart health is to start walking. It's enjoyable, free, easy, social and great exercise. A walking program is flexible and boasts high success rates because people can stick with it. It's easy for walking to become a regular and satisfying part of  life.   For Overall Cardiovascular Health:  At least 30 minutes of moderate-intensity aerobic activity at least 5 days per week for a total of 150  OR   At least 25 minutes of vigorous aerobic activity at least 3 days per week for a total of 75 minutes; or a combination of moderate- and vigorous-intensity aerobic activity  AND   Moderate- to high-intensity muscle-strengthening activity at least 2 days per week for additional health benefits.  For Lowering Blood Pressure and Cholesterol  An average 40 minutes of moderate- to vigorous-intensity aerobic activity 3 or 4 times per week  What if I can't make it to the time goal? Something is always better than nothing! And everyone has to start somewhere. Even if you've been sedentary for years, today is the day you can begin to make healthy changes in your life. If you don't think you'll make it for 30 or 40 minutes, set a reachable goal for today. You can work up toward your overall goal by increasing your time as you get stronger. Don't let all-or-nothing thinking rob you of doing what you can every day.  Source:http://www.heart.org   Results for orders placed or performed in visit on 07/02/18  Lipid Panel  Result Value Ref Range   Cholesterol 196 <200 mg/dL   HDL 60 > OR = 50 mg/dL   Triglycerides 63 <150 mg/dL   LDL Cholesterol (Calc) 121 (H) mg/dL (calc)   Total CHOL/HDL Ratio 3.3 <5.0 (calc)   Non-HDL Cholesterol (Calc) 136 (H) <130 mg/dL (calc)  Comprehensive metabolic panel  Result Value Ref Range   Glucose, Bld 95 65 - 99 mg/dL   BUN 13 7 - 25 mg/dL   Creat 0.75 0.50 - 1.10 mg/dL   BUN/Creatinine Ratio NOT APPLICABLE 6 - 22 (calc)   Sodium 139 135 - 146 mmol/L   Potassium 4.4 3.5 - 5.3 mmol/L   Chloride 107 98 - 110 mmol/L   CO2 24 20 - 32 mmol/L   Calcium 8.9 8.6 - 10.2 mg/dL   Total Protein 6.4 6.1 - 8.1 g/dL   Albumin 4.2 3.6 - 5.1 g/dL   Globulin 2.2 1.9 - 3.7 g/dL (calc)   AG Ratio 1.9 1.0 - 2.5 (calc)    Total Bilirubin 0.4 0.2 - 1.2 mg/dL   Alkaline phosphatase (APISO) 46 31 - 125 U/L   AST 13 10 - 30 U/L   ALT 12 6 - 29 U/L  CBC with Differential/Platelet  Result Value Ref Range   WBC 6.1 3.8 - 10.8 Thousand/uL   RBC 4.39 3.80 - 5.10 Million/uL   Hemoglobin 13.5 11.7 - 15.5 g/dL   HCT 39.4 35.0 - 45.0 %   MCV 89.7 80.0 - 100.0 fL   MCH 30.8 27.0 - 33.0 pg   MCHC 34.3 32.0 - 36.0 g/dL   RDW 12.2 11.0 - 15.0 %   Platelets 253 140 - 400 Thousand/uL   MPV 10.9 7.5 - 12.5 fL   Neutro Abs 3,306 1,500 - 7,800 cells/uL   Lymphs Abs 2,031 850 - 3,900 cells/uL   Absolute Monocytes 567 200 - 950 cells/uL   Eosinophils Absolute 146 15 - 500 cells/uL   Basophils Absolute 49 0 - 200 cells/uL   Neutrophils Relative % 54.2 %   Total Lymphocyte 33.3 %   Monocytes Relative 9.3 %   Eosinophils Relative 2.4 %   Basophils Relative 0.8 %

## 2018-07-07 NOTE — Progress Notes (Signed)
Patient: Catherine Adams, Female    DOB: October 27, 1975, 43 y.o.   MRN: [id number] Visit Date: 07/07/2018  Today's Provider: Danelle Berry, PA-C   Chief Complaint  Patient presents with  . Annual Exam   Subjective:    Annual physical exam Catherine Adams is a 43 y.o. female who presents today for health maintenance and complete physical. She feels well. She reports exercising biking riding, going to weight watches. She reports she is sleeping well.  -----------------------------------------------------------------   Review of Systems  Constitutional: Negative.  Negative for activity change, appetite change, fatigue and unexpected weight change.  HENT: Negative.   Eyes: Negative.   Respiratory: Negative.  Negative for shortness of breath.   Cardiovascular: Negative.  Negative for chest pain, palpitations and leg swelling.  Gastrointestinal: Negative.  Negative for abdominal pain and blood in stool.  Endocrine: Negative.   Genitourinary: Negative.   Musculoskeletal: Negative.  Negative for arthralgias, gait problem, joint swelling and myalgias.  Skin: Negative.  Negative for color change, pallor and rash.  Allergic/Immunologic: Negative.   Neurological: Negative.  Negative for syncope and weakness.  Hematological: Negative.   Psychiatric/Behavioral: Negative.  Negative for confusion, dysphoric mood, self-injury and suicidal ideas. The patient is not nervous/anxious.     Social History      She  reports that she has quit smoking. She has never used smokeless tobacco. She reports that she does not drink alcohol or use drugs.       Social History   Socioeconomic History  . Marital status: Married    Spouse name: Not on file  . Number of children: Not on file  . Years of education: Not on file  . Highest education level: Not on file  Occupational History  . Not on file  Social Needs  . Financial resource strain: Not on file  . Food insecurity:    Worry: Not on file   Inability: Not on file  . Transportation needs:    Medical: Not on file    Non-medical: Not on file  Tobacco Use  . Smoking status: Former Games developer  . Smokeless tobacco: Never Used  Substance and Sexual Activity  . Alcohol use: No  . Drug use: No  . Sexual activity: Yes    Birth control/protection: Surgical  Lifestyle  . Physical activity:    Days per week: Not on file    Minutes per session: Not on file  . Stress: Not on file  Relationships  . Social connections:    Talks on phone: Not on file    Gets together: Not on file    Attends religious service: Not on file    Active member of club or organization: Not on file    Attends meetings of clubs or organizations: Not on file    Relationship status: Not on file  Other Topics Concern  . Not on file  Social History Narrative  . Not on file    Past Medical History:  Diagnosis Date  . Anxiety      Patient Active Problem List   Diagnosis Date Noted  . GERD (gastroesophageal reflux disease) 05/27/2017  . Thyroid nodule 05/27/2017  . Abdominal pain, unspecified site 07/29/2012  . Anxiety     Past Surgical History:  Procedure Laterality Date  . TUBAL LIGATION      Family History        Family Status  Relation Name Status  . Mother Good Health Alive  . Father  Alive  .  Brother Fish farm manager  . Brother Drug Programme researcher, broadcasting/film/video  . MGM  Alive  . MGF  Alive  . PGM  Deceased  . PGF  Alive        Her family history includes Cancer in her maternal grandmother; Drug abuse in her brother; Heart disease (age of onset: 76) in her father.      Allergies  Allergen Reactions  . Codeine     REACTION: N\T\V  . Monistat [Miconazole Nitrate-Wipes]      Current Outpatient Medications:  .  ALPRAZolam (XANAX) 0.25 MG tablet, Take 1 tablet (0.25 mg total) by mouth at bedtime as needed., Disp: 30 tablet, Rfl: 1 .  cyclobenzaprine (FLEXERIL) 10 MG tablet, Take 1 tablet (10 mg total) by mouth 2 (two) times daily as needed for  muscle spasms., Disp: 20 tablet, Rfl: 0 .  fluconazole (DIFLUCAN) 150 MG tablet, Take 1 tablet (150 mg total) by mouth every 3 (three) days as needed (for vaginal itching/yeast infection sx)., Disp: 2 tablet, Rfl: 1 .  pantoprazole (PROTONIX) 40 MG tablet, TAKE 1 TABLET BY MOUTH EVERY DAY, Disp: 30 tablet, Rfl: 6   Patient Care Team: Salley Scarlet, MD as PCP - General (Family Medicine)      Objective:   Vitals: BP 110/78   Pulse 84   Temp 98.2 F (36.8 C)   Resp 16   Ht  (1.575 m)   Wt 171 lb 9.6 oz (77.8 kg)   SpO2 98%   BMI 31.39 kg/m    Vitals:   07/07/18 1001  BP: 110/78  Pulse: 84  Resp: 16  Temp: 98.2 F (36.8 C)  SpO2: 98%  Weight: 171 lb 9.6 oz (77.8 kg)  Height:  (1.575 m)     Physical Exam Vitals signs and nursing note reviewed.  Constitutional:      General: She is not in acute distress.    Appearance: Normal appearance. She is well-developed. She is not ill-appearing, toxic-appearing or diaphoretic.  HENT:     Head: Normocephalic and atraumatic.     Right Ear: Tympanic membrane, ear canal and external ear normal.     Left Ear: Tympanic membrane, ear canal and external ear normal.     Nose: Nose normal. No congestion or rhinorrhea.     Mouth/Throat:     Mouth: Mucous membranes are moist.     Pharynx: Oropharynx is clear. Uvula midline.  Eyes:     General: Lids are normal. No scleral icterus.    Conjunctiva/sclera: Conjunctivae normal.     Pupils: Pupils are equal, round, and reactive to light.  Neck:     Musculoskeletal: Normal range of motion and neck supple. No neck rigidity.     Thyroid: Thyromegaly present. No thyroid tenderness.     Trachea: Phonation normal. No tracheal deviation.  Cardiovascular:     Rate and Rhythm: Normal rate and regular rhythm.     Chest Wall: PMI is not displaced.     Pulses: Normal pulses.          Radial pulses are 2+ on the right side and 2+ on the left side.       Posterior tibial pulses are 2+ on the  right side and 2+ on the left side.     Heart sounds: Normal heart sounds. No murmur. No friction rub. No gallop. No S3 or S4 sounds.   Pulmonary:     Effort: Pulmonary effort is normal. No respiratory distress.  Breath sounds: Normal breath sounds. No stridor. No wheezing, rhonchi or rales.  Chest:     Chest wall: No tenderness.  Abdominal:     General: Bowel sounds are normal. There is no distension.     Palpations: Abdomen is soft.     Tenderness: There is no abdominal tenderness. There is no guarding or rebound.  Musculoskeletal: Normal range of motion.        General: No deformity.     Right lower leg: No edema.     Left lower leg: No edema.  Lymphadenopathy:     Cervical: No cervical adenopathy.  Skin:    General: Skin is warm and dry.     Capillary Refill: Capillary refill takes less than 2 seconds.     Coloration: Skin is not pale.     Findings: No rash.  Neurological:     Mental Status: She is alert and oriented to person, place, and time.     Cranial Nerves: Cranial nerves are intact.     Motor: No tremor or abnormal muscle tone.     Gait: Gait normal.  Psychiatric:        Attention and Perception: Attention normal.        Mood and Affect: Mood normal.        Speech: Speech normal.        Behavior: Behavior normal.        Thought Content: Thought content normal.        Cognition and Memory: Cognition normal.        Judgment: Judgment normal.      Depression Screen PHQ 2/9 Scores 07/07/2018 05/27/2017 04/25/2017 03/03/2017  PHQ - 2 Score 0 0 0 0  PHQ- 9 Score - - 0 0   GAD 7 : Generalized Anxiety Score 07/07/2018  Nervous, Anxious, on Edge 0  Control/stop worrying 0  Worry too much - different things 0  Trouble relaxing 0  Restless 0  Easily annoyed or irritable 0  Afraid - awful might happen 0  Total GAD 7 Score 0  Anxiety Difficulty Not difficult at all     Office Visit from 07/07/2018 in [facility]  AUDIT-C Score  0      Results for  orders placed or performed in visit on 07/02/18  Lipid Panel  Result Value Ref Range   Cholesterol 196 <200 mg/dL   HDL 60 > OR = 50 mg/dL   Triglycerides 63 <161 mg/dL   LDL Cholesterol (Calc) 121 (H) mg/dL (calc)   Total CHOL/HDL Ratio 3.3 <5.0 (calc)   Non-HDL Cholesterol (Calc) 136 (H) <130 mg/dL (calc)  Comprehensive metabolic panel  Result Value Ref Range   Glucose, Bld 95 65 - 99 mg/dL   BUN 13 7 - 25 mg/dL   Creat 0.96 0.45 - 4.09 mg/dL   BUN/Creatinine Ratio NOT APPLICABLE 6 - 22 (calc)   Sodium 139 135 - 146 mmol/L   Potassium 4.4 3.5 - 5.3 mmol/L   Chloride 107 98 - 110 mmol/L   CO2 24 20 - 32 mmol/L   Calcium 8.9 8.6 - 10.2 mg/dL   Total Protein 6.4 6.1 - 8.1 g/dL   Albumin 4.2 3.6 - 5.1 g/dL   Globulin 2.2 1.9 - 3.7 g/dL (calc)   AG Ratio 1.9 1.0 - 2.5 (calc)   Total Bilirubin 0.4 0.2 - 1.2 mg/dL   Alkaline phosphatase (APISO) 46 31 - 125 U/L   AST 13 10 - 30 U/L  ALT 12 6 - 29 U/L  CBC with Differential/Platelet  Result Value Ref Range   WBC 6.1 3.8 - 10.8 Thousand/uL   RBC 4.39 3.80 - 5.10 Million/uL   Hemoglobin 13.5 11.7 - 15.5 g/dL   HCT 78.439.4 69.635.0 - 29.545.0 %   MCV 89.7 80.0 - 100.0 fL   MCH 30.8 27.0 - 33.0 pg   MCHC 34.3 32.0 - 36.0 g/dL   RDW 28.412.2 13.211.0 - 44.015.0 %   Platelets 253 140 - 400 Thousand/uL   MPV 10.9 7.5 - 12.5 fL   Neutro Abs 3,306 1,500 - 7,800 cells/uL   Lymphs Abs 2,031 850 - 3,900 cells/uL   Absolute Monocytes 567 200 - 950 cells/uL   Eosinophils Absolute 146 15 - 500 cells/uL   Basophils Absolute 49 0 - 200 cells/uL   Neutrophils Relative % 54.2 %   Total Lymphocyte 33.3 %   Monocytes Relative 9.3 %   Eosinophils Relative 2.4 %   Basophils Relative 0.8 %        Assessment & Plan:     Routine Health Maintenance and Physical Exam Discussed health benefits of physical activity, and encouraged her to engage in regular exercise appropriate for her age and condition.   Immunization History  Administered Date(s) Administered  .  DTaP 11/06/2009    Health Maintenance  Topic Date Due  . PAP SMEAR-Modifier  05/23/2017  . INFLUENZA VACCINE  09/19/2018  . TETANUS/TDAP  02/18/2021  . HIV Screening  Completed   Going to OBGYN for PAP next week    Problem List Items Addressed This Visit      Endocrine   Thyroid nodule - Primary    Recheck thyroid labs Per pt no change to thyroid/anterior neck      Relevant Orders   TSH   T4, Free   T3, Free     Other   Hyperlipidemia    Labs reviewed with pt, high LDL  Diet and exercise recommended - AHA HLD handout and diet instructions given and reviewed  ASCVD risk calculator - risk very low - 0.4%, tx with lifestyle changes F/up with next routine visit with PCP        Other Visit Diagnoses    Routine general medical examination at a health care facility         Possibly perimenopausal - some sweats/hot flashes at night.  Periods a little heavier.  OBGYN wanted labs done, including thyroid, to r/o other cuase, and she is following up with them next week for PAP/pelvic/breast exam    Danelle BerryLeisa Ferrah Panagopoulos, PA-C 07/07/18 10:42 AM  [facility]

## 2018-07-08 LAB — T3, FREE: T3, Free: 3 pg/mL (ref 2.3–4.2)

## 2018-07-08 LAB — T4, FREE: Free T4: 1.1 ng/dL (ref 0.8–1.8)

## 2018-07-08 LAB — TSH: TSH: 0.95 mIU/L

## 2018-11-02 ENCOUNTER — Other Ambulatory Visit: Payer: Self-pay | Admitting: Family Medicine

## 2018-11-03 NOTE — Telephone Encounter (Signed)
Ok to refill??  Last office visit 07/07/2018.  Last refill 02/04/2018, #1 refill.

## 2019-03-01 ENCOUNTER — Other Ambulatory Visit: Payer: Self-pay | Admitting: Family Medicine

## 2019-03-31 IMAGING — US US THYROID
1 series · 13 of 25 positions shown · non-contrast
Comparison: None.

CLINICAL DATA: Thyromegaly on exam

EXAM:
THYROID ULTRASOUND
TECHNIQUE: Ultrasound examination of the thyroid gland and adjacent soft
tissues was performed.

[Series 1: us thyroid · 0.06mm/px · 13 of 47 slices shown]
[im 1/47]
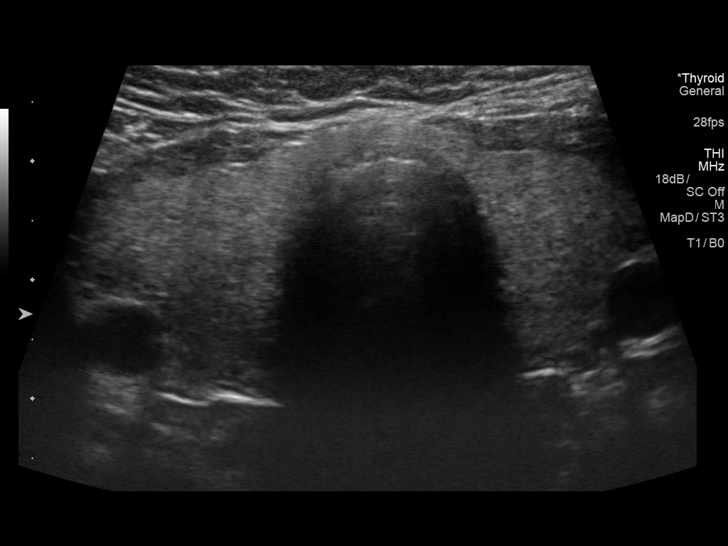
[im 4/47]
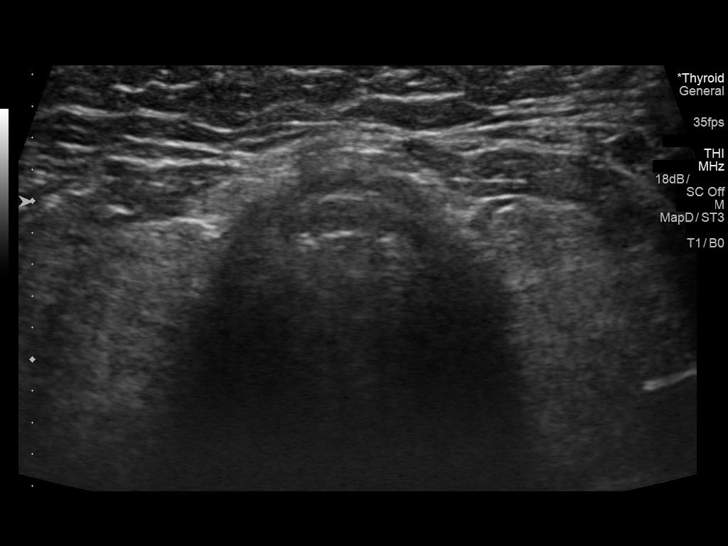
[im 8/47]
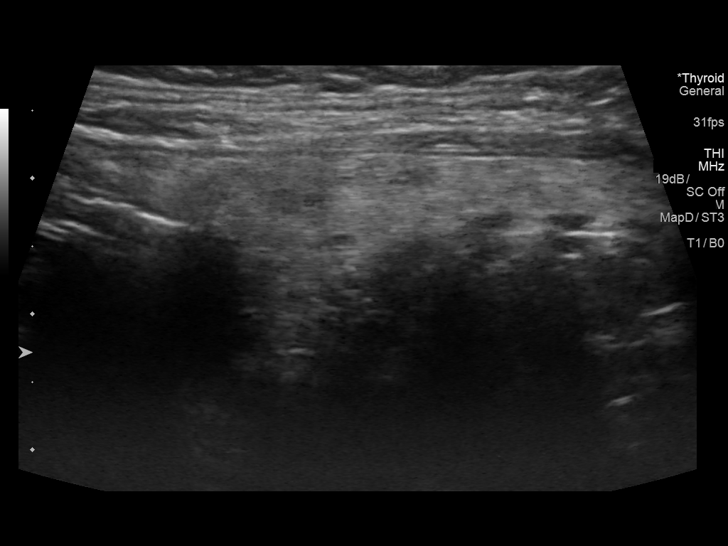
[im 12/47]
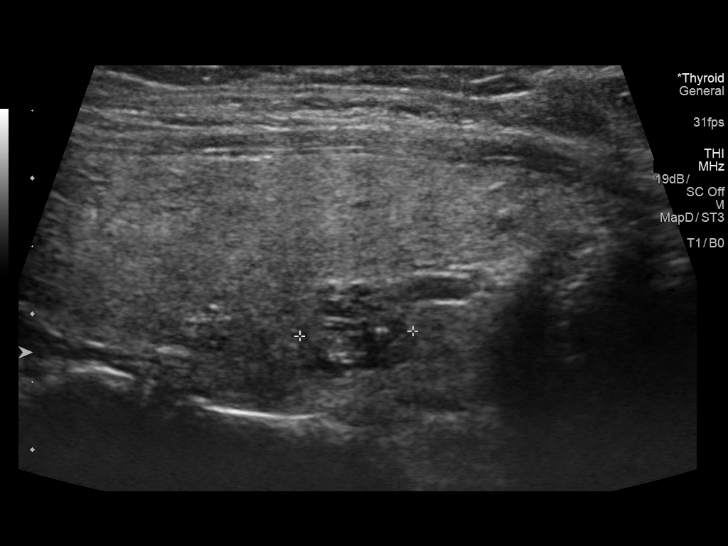
[im 16/47]
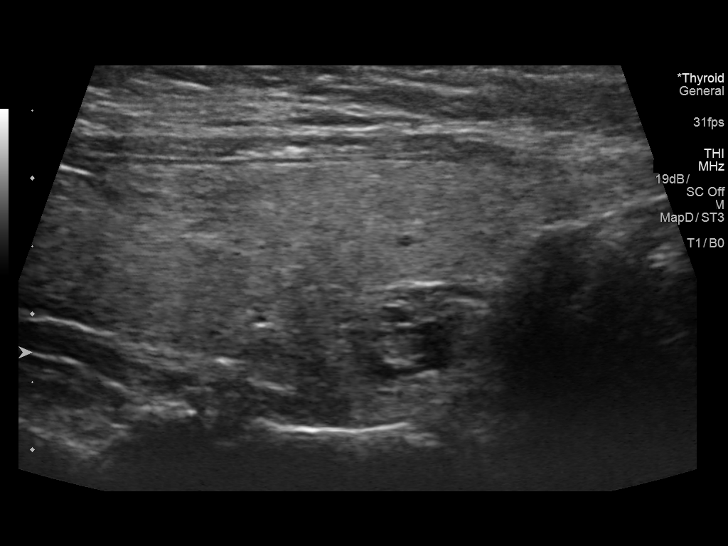
[im 20/47]
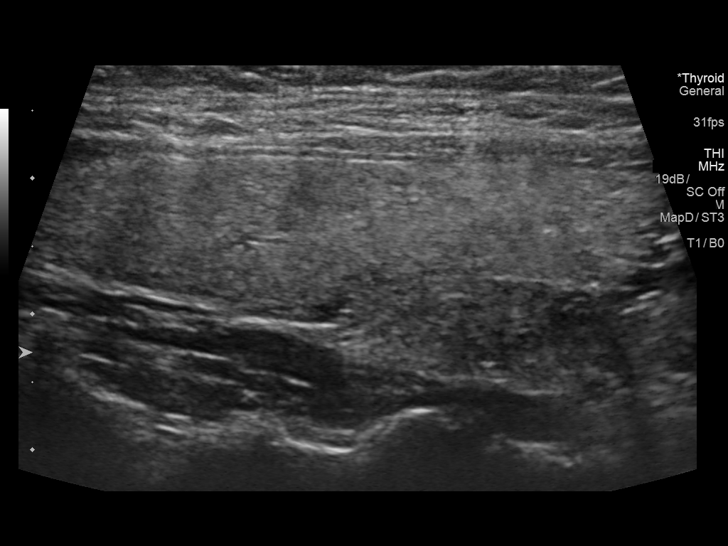
[im 24/47]
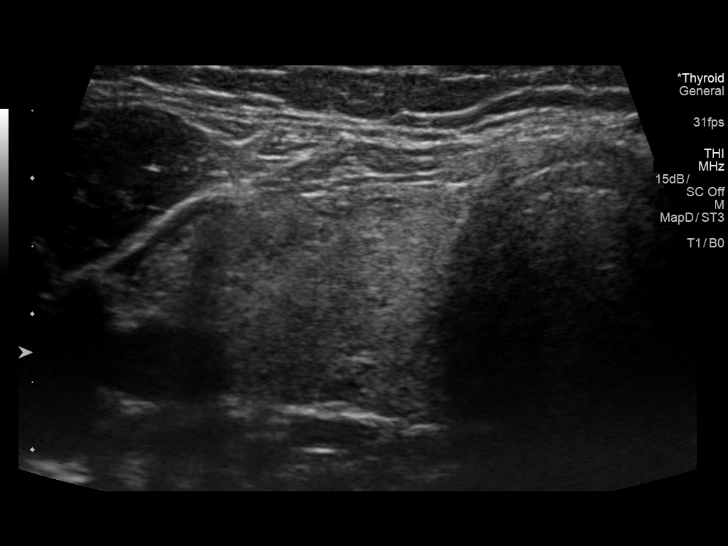
[im 27/47]
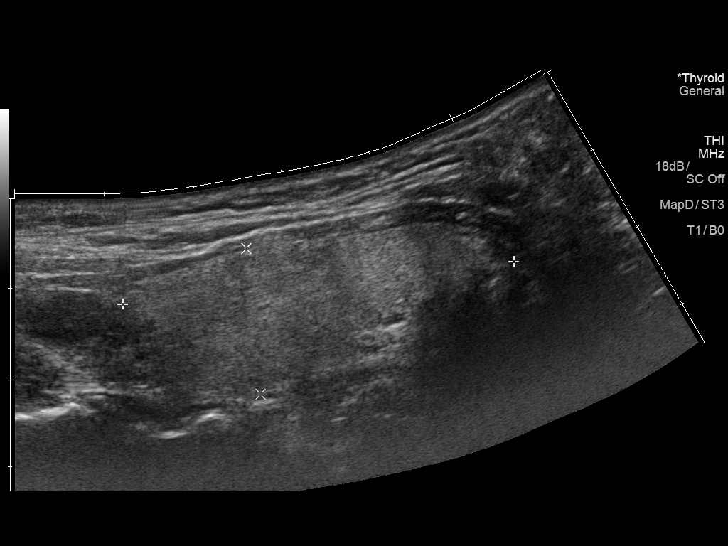
[im 31/47]
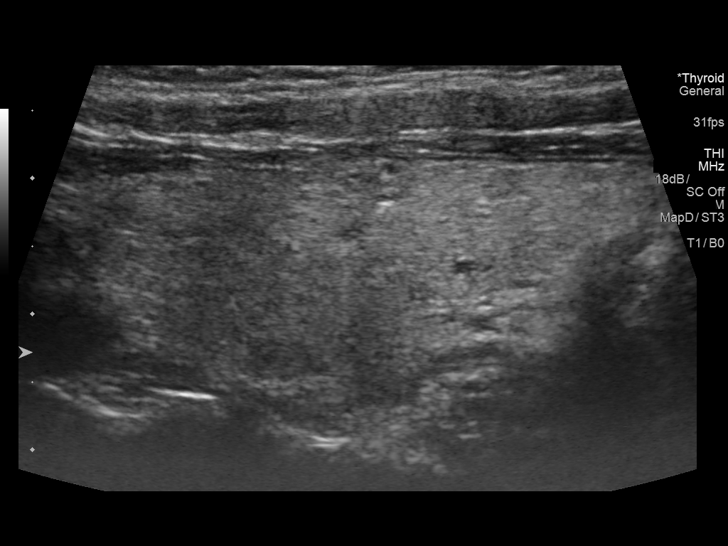
[im 35/47]
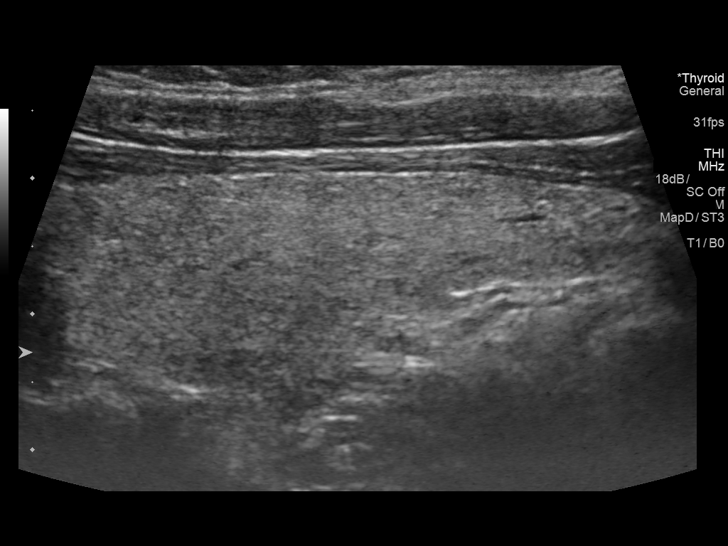
[im 39/47]
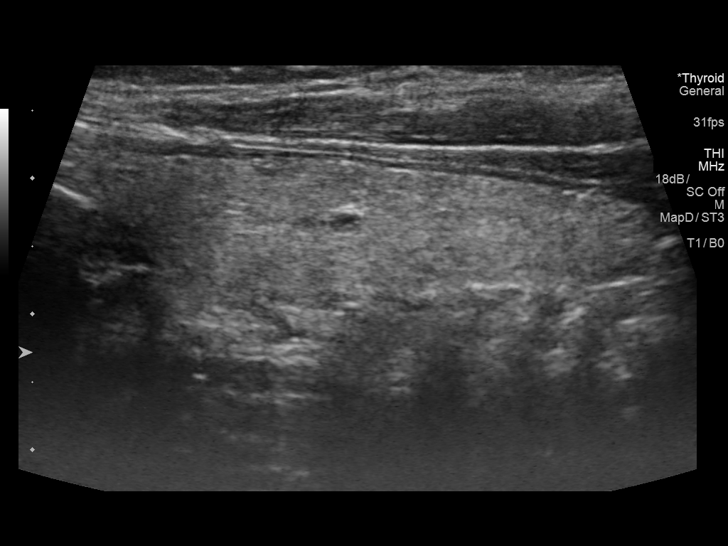
[im 43/47]
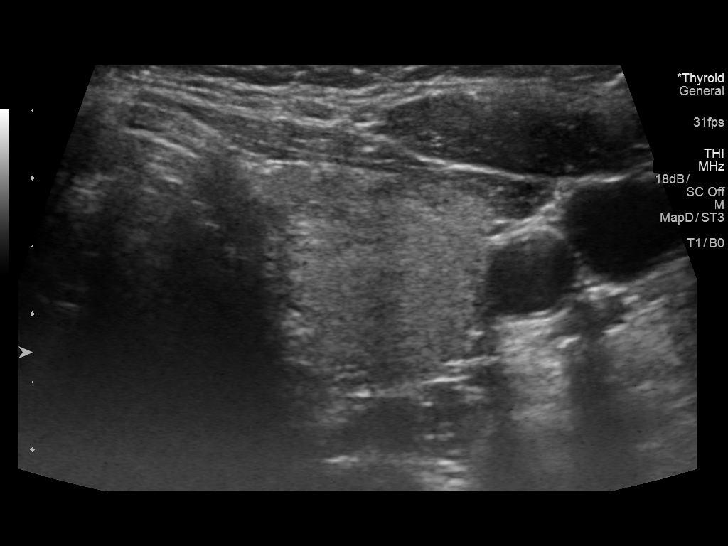
[im 47/47]
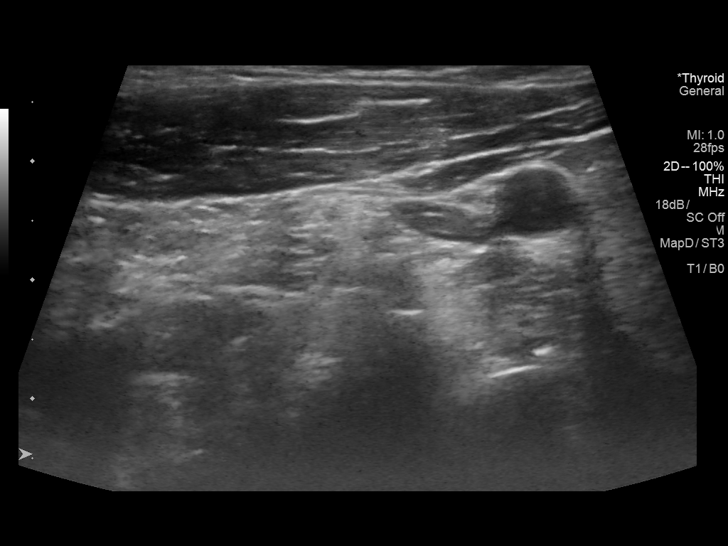

[13 of 25 positions shown; findings below may reference images not displayed]

FINDINGS: Parenchymal Echotexture: Mildly heterogenous

Isthmus: 3 mm

Right lobe:   5.1 x 2.1 x 2.3 cm

Left lobe:   4.4 x 1.6 x 1.5 cm

_________________________________________________________

Estimated total number of nodules >/= 1 cm: 1

Number of spongiform nodules >/=  2 cm not described below (TR1): 0

Number of mixed cystic and solid nodules >/= 1.5 cm not described
below (TR2): 0

_________________________________________________________

Nodule # 1:

Location: Right; Inferior

Maximum size: 1.2 cm; Other 2 dimensions: 0.8 x 1.0 cm

Composition: solid/almost completely solid (2)

Echogenicity: isoechoic (1)

Shape: not taller-than-wide (0)

Margins: ill-defined (0)

Echogenic foci: none (0)

ACR TI-RADS total points: 3.

ACR TI-RADS risk category: TR3 (3 points).

ACR TI-RADS recommendations:

Given size (<1.4 cm) and appearance, this nodule does NOT meet
TI-RADS criteria for biopsy or dedicated follow-up.

_________________________________________________________

No other significant thyroid abnormality.  No adenopathy.
IMPRESSION: 1.2 cm right inferior TR 3 nodule does not meet criteria biopsy or
any follow-up.

The above is in keeping with the ACR TI-RADS recommendations - [HOSPITAL] 6075;[DATE].

## 2019-07-06 ENCOUNTER — Other Ambulatory Visit: Payer: Self-pay | Admitting: Family Medicine

## 2019-07-06 ENCOUNTER — Other Ambulatory Visit: Payer: 59

## 2019-07-06 ENCOUNTER — Other Ambulatory Visit: Payer: Self-pay

## 2019-07-06 DIAGNOSIS — Z0000 Encounter for general adult medical examination without abnormal findings: Secondary | ICD-10-CM

## 2019-07-06 LAB — COMPREHENSIVE METABOLIC PANEL
AG Ratio: 1.6 (calc) (ref 1.0–2.5)
ALT: 20 U/L (ref 6–29)
AST: 14 U/L (ref 10–30)
Albumin: 4.2 g/dL (ref 3.6–5.1)
Alkaline phosphatase (APISO): 52 U/L (ref 31–125)
BUN: 10 mg/dL (ref 7–25)
CO2: 27 mmol/L (ref 20–32)
Calcium: 9.4 mg/dL (ref 8.6–10.2)
Chloride: 103 mmol/L (ref 98–110)
Creat: 0.74 mg/dL (ref 0.50–1.10)
Globulin: 2.7 g/dL (calc) (ref 1.9–3.7)
Glucose, Bld: 104 mg/dL — ABNORMAL HIGH (ref 65–99)
Potassium: 4.3 mmol/L (ref 3.5–5.3)
Sodium: 137 mmol/L (ref 135–146)
Total Bilirubin: 0.7 mg/dL (ref 0.2–1.2)
Total Protein: 6.9 g/dL (ref 6.1–8.1)

## 2019-07-06 LAB — CBC WITH DIFFERENTIAL/PLATELET
Absolute Monocytes: 583 cells/uL (ref 200–950)
Basophils Absolute: 47 cells/uL (ref 0–200)
Basophils Relative: 0.7 %
Eosinophils Absolute: 121 cells/uL (ref 15–500)
Eosinophils Relative: 1.8 %
HCT: 42.5 % (ref 35.0–45.0)
Hemoglobin: 14.3 g/dL (ref 11.7–15.5)
Lymphs Abs: 2513 cells/uL (ref 850–3900)
MCH: 30.1 pg (ref 27.0–33.0)
MCHC: 33.6 g/dL (ref 32.0–36.0)
MCV: 89.5 fL (ref 80.0–100.0)
MPV: 10.7 fL (ref 7.5–12.5)
Monocytes Relative: 8.7 %
Neutro Abs: 3437 cells/uL (ref 1500–7800)
Neutrophils Relative %: 51.3 %
Platelets: 266 10*3/uL (ref 140–400)
RBC: 4.75 10*6/uL (ref 3.80–5.10)
RDW: 11.9 % (ref 11.0–15.0)
Total Lymphocyte: 37.5 %
WBC: 6.7 10*3/uL (ref 3.8–10.8)

## 2019-07-06 LAB — LIPID PANEL
Cholesterol: 223 mg/dL — ABNORMAL HIGH (ref <200)
HDL: 68 mg/dL (ref >=50)
LDL Cholesterol (Calc): 133 mg/dL (calc) — ABNORMAL HIGH
Non-HDL Cholesterol (Calc): 155 mg/dL (calc) — ABNORMAL HIGH (ref <130)
Total CHOL/HDL Ratio: 3.3 (calc) (ref <5.0)
Triglycerides: 110 mg/dL (ref <150)

## 2019-07-09 ENCOUNTER — Encounter: Payer: Self-pay | Admitting: Family Medicine

## 2019-07-09 ENCOUNTER — Other Ambulatory Visit: Payer: Self-pay

## 2019-07-09 ENCOUNTER — Ambulatory Visit (INDEPENDENT_AMBULATORY_CARE_PROVIDER_SITE_OTHER): Payer: 59 | Admitting: Family Medicine

## 2019-07-09 VITALS — BP 110/72 | HR 78 | Temp 98.0°F | Resp 14 | Ht 62.0 in | Wt 174.0 lb

## 2019-07-09 DIAGNOSIS — Z0001 Encounter for general adult medical examination with abnormal findings: Secondary | ICD-10-CM

## 2019-07-09 DIAGNOSIS — E66811 Obesity, class 1: Secondary | ICD-10-CM | POA: Insufficient documentation

## 2019-07-09 DIAGNOSIS — B351 Tinea unguium: Secondary | ICD-10-CM

## 2019-07-09 DIAGNOSIS — E785 Hyperlipidemia, unspecified: Secondary | ICD-10-CM | POA: Diagnosis not present

## 2019-07-09 DIAGNOSIS — K529 Noninfective gastroenteritis and colitis, unspecified: Secondary | ICD-10-CM

## 2019-07-09 DIAGNOSIS — K219 Gastro-esophageal reflux disease without esophagitis: Secondary | ICD-10-CM

## 2019-07-09 DIAGNOSIS — Z0000 Encounter for general adult medical examination without abnormal findings: Secondary | ICD-10-CM

## 2019-07-09 DIAGNOSIS — E041 Nontoxic single thyroid nodule: Secondary | ICD-10-CM

## 2019-07-09 DIAGNOSIS — F419 Anxiety disorder, unspecified: Secondary | ICD-10-CM

## 2019-07-09 DIAGNOSIS — E669 Obesity, unspecified: Secondary | ICD-10-CM

## 2019-07-09 MED ORDER — TERBINAFINE HCL 250 MG PO TABS: 250.0000 mg | ORAL_TABLET | Freq: Every day | ORAL | 1 refills | Status: DC

## 2019-07-09 MED ORDER — DIPHENOXYLATE-ATROPINE 2.5-0.025 MG PO TABS: 1.0000 | ORAL_TABLET | Freq: Three times a day (TID) | ORAL | 1 refills | Status: DC | PRN

## 2019-07-09 MED ORDER — ALPRAZOLAM 0.25 MG PO TABS: 0.2500 mg | ORAL_TABLET | Freq: Every evening | ORAL | 1 refills | Status: DC | PRN

## 2019-07-09 NOTE — Assessment & Plan Note (Signed)
Diarrhea with incontinence episodes.  She has been evaluated by gastroenterology in her GYN.  She prefers to just have something on hand when she has the looser stools Lomotil can cause her to have constipation which may worsen her issues with getting her bowel movements out.  She is only going to use sparingly

## 2019-07-09 NOTE — Assessment & Plan Note (Signed)
She is working on dietary changes now with [employer].  We will recheck her labs in about 4 months before deciding on statin drug therapy.

## 2019-07-09 NOTE — Assessment & Plan Note (Signed)
For further discussion she does not want to go on a daily medication.  She will continue as needed alprazolam and last fill this in September 2020

## 2019-07-09 NOTE — Assessment & Plan Note (Signed)
Due for repeat thyroid ultrasound

## 2019-07-09 NOTE — Patient Instructions (Addendum)
Take terbinafine once a day for nail fungus Lomotil as needed for diarrhea  Keep working on dietary changes  Thyroid ultrasound to be done  F/U 6 weeks for Lab visit

## 2019-07-09 NOTE — Assessment & Plan Note (Addendum)
Pt recently started weight watchers

## 2019-07-09 NOTE — Assessment & Plan Note (Signed)
Taking Protonix as needed.

## 2019-07-09 NOTE — Progress Notes (Signed)
Subjective:    Patient ID: Catherine Adams, female    DOB: [birth year], 44 y.o.   MRN: [id number]  Patient presents for Annual Exam (has had labs)  Pt here for CPE  Medications and history reviewed  She started [facility] this week started at 176.4, today 174.6    She has GYN F/U June 1st for PAP Smear and Mammogram    Immunizations- TDAP UTD, discussed COVID-19 vaccine    No concerns for vision   Dental visit for Monday    Insomnia/Anxiety - prn xanax, needs refill, last done in Fall 2020, at times she does feel overwhelmed with regards to her anxiety and has consider going on a daily medication after discussions with her GYN but she then notes that she will not take anything regularly but does not want to change her as needed alprazolam   GERD-  Taking protonix as needed and it controls symptoms    Told she has a fungus on nail  Noticed at pedicure , left foot great toenail    She has chronic intermittant loose stool with incontinence this has been going on for many years since she had severe 4th degree perineal tear, she uses pepto or immodium, sometimes if constipated MOM She has seen GI and GYN, both recommend surgery but she doesn't want possible complication of needing colostomy. Request something prescription she can use especially when she travels   Thyroid nodule , due for repeat  Review Of Systems:  GEN- denies fatigue, fever, weight loss,weakness, recent illness HEENT- denies eye drainage, change in vision, nasal discharge, CVS- denies chest pain, palpitations RESP- denies SOB, cough, wheeze ABD- denies N/V, change in stools, abd pain GU- denies dysuria, hematuria, dribbling, incontinence MSK- denies joint pain, muscle aches, injury Neuro- denies headache, dizziness, syncope, seizure activity       Objective:    BP 110/72   Pulse 78   Temp 98 F (36.7 C) (Temporal)   Resp 14   Ht 5\' 2"  (1.575 m)   Wt 174 lb (78.9 kg)   SpO2 98%   BMI 31.83 kg/m  GEN- NAD,  alert and oriented x3 HEENT- PERRL, EOMI, non injected sclera, pink conjunctiva, MMM, oropharynx clear Neck- Supple, no thyromegaly CVS- RRR, no murmur RESP-CTAB ABD-NABS,soft,NT,ND Psych- normal affect and mood Skin- bilat great toenails, mild fungus changes mid nail to end with mild yellowing left, no color change on right EXT- No edema Pulses- Radial, DP- 2+        Assessment & Plan:      Problem List Items Addressed This Visit      Unprioritized   Anxiety    For further discussion she does not want to go on a daily medication.  She will continue as needed alprazolam and last fill this in September 2020      Relevant Medications   ALPRAZolam (XANAX) 0.25 MG tablet   Chronic diarrhea    Diarrhea with incontinence episodes.  She has been evaluated by gastroenterology in her GYN.  She prefers to just have something on hand when she has the looser stools Lomotil can cause her to have constipation which may worsen her issues with getting her bowel movements out.  She is only going to use sparingly       Class 1 obesity    Pt recently started weight watchers       GERD (gastroesophageal reflux disease)    Taking Protonix as needed      Relevant Medications  diphenoxylate-atropine (LOMOTIL) 2.5-0.025 MG tablet   Hyperlipidemia    She is working on dietary changes now with [employer].  We will recheck her labs in about 4 months before deciding on statin drug therapy.      Thyroid nodule    Due for repeat thyroid ultrasound.      Relevant Orders   US THYROID    Other Visit Diagnoses    Routine general medical examination at a health care facility    -  Primary   CPE done, f/u GYN, discussed COVID-19 vaccine   Toenail fungus       discussed oral vs topical, pt prefers oral therapy, needs LFT in 6 weeks   Relevant Medications   terbinafine (LAMISIL) 250 MG tablet      Note: This dictation was prepared with Dragon dictation along with smaller phrase  technology. Any transcriptional errors that result from this process are unintentional.

## 2019-07-14 ENCOUNTER — Telehealth: Payer: Self-pay | Admitting: Family Medicine

## 2019-07-14 NOTE — Telephone Encounter (Signed)
Agree to step the medication and see if symptoms resolved

## 2019-07-14 NOTE — Telephone Encounter (Signed)
#  CB [phone] Pt taking Terbinafine having some side effects mouth dry,headache. Should she continue taking

## 2019-07-14 NOTE — Telephone Encounter (Signed)
Call placed to patient to inquire.   States that she has been taking Lamisil in the AM. Reports that she had x2 days of lightheadedness, HA and nausea. States that she has some L sided ABD pain today, but it is not severe. States that she has had increase in flatulence since taking medication.   Advised to hold medication. Advised to contact office if Sx worsen or do not improve. Advised to contact office on Friday to follow up via telephone.

## 2019-07-16 ENCOUNTER — Telehealth: Payer: Self-pay | Admitting: Family Medicine

## 2019-07-16 NOTE — Telephone Encounter (Signed)
Patient had been advised to hold Lamisil due to her varying Sx.   States that she has been taking Lamisil in the AM. Reports that she had x2 days of lightheadedness, HA and nausea. States that she has some L sided ABD pain today, but it is not severe. States that she has had increase in flatulence since taking medication.

## 2019-07-16 NOTE — Telephone Encounter (Signed)
Call placed to patient and patient made aware.  

## 2019-07-16 NOTE — Telephone Encounter (Signed)
If symptoms resolved okay to staff off meds  We had discussed that it was not severe So she doesn't have to treat, unless she changed her mind on using the topical for her toenails  Penlac liquid

## 2019-07-16 NOTE — Telephone Encounter (Signed)
CB# [phone] Pt was advise to hold medication also call office follow up how she's feeling doing just fine.

## 2019-07-21 ENCOUNTER — Ambulatory Visit
Admission: RE | Admit: 2019-07-21 | Discharge: 2019-07-21 | Disposition: A | Discharge status: Home / Self Care | Payer: 59 | Source: Ambulatory Visit | Attending: Family Medicine | Admitting: Family Medicine

## 2019-07-21 ENCOUNTER — Other Ambulatory Visit: Payer: Self-pay

## 2019-07-21 DIAGNOSIS — E041 Nontoxic single thyroid nodule: Secondary | ICD-10-CM

## 2019-08-20 ENCOUNTER — Other Ambulatory Visit: Payer: 59

## 2020-01-03 ENCOUNTER — Ambulatory Visit: Payer: 59 | Admitting: Family Medicine

## 2020-01-19 ENCOUNTER — Encounter: Payer: Self-pay | Admitting: Family Medicine

## 2020-01-19 ENCOUNTER — Other Ambulatory Visit: Payer: Self-pay

## 2020-01-19 ENCOUNTER — Ambulatory Visit: Payer: 59 | Admitting: Family Medicine

## 2020-01-19 VITALS — BP 112/64 | HR 84 | Temp 98.4°F | Resp 16 | Ht 62.0 in | Wt 175.0 lb

## 2020-01-19 DIAGNOSIS — M7731 Calcaneal spur, right foot: Secondary | ICD-10-CM

## 2020-01-19 DIAGNOSIS — M25473 Effusion, unspecified ankle: Secondary | ICD-10-CM | POA: Diagnosis not present

## 2020-01-19 DIAGNOSIS — B351 Tinea unguium: Secondary | ICD-10-CM

## 2020-01-19 DIAGNOSIS — E669 Obesity, unspecified: Secondary | ICD-10-CM | POA: Diagnosis not present

## 2020-01-19 DIAGNOSIS — E041 Nontoxic single thyroid nodule: Secondary | ICD-10-CM

## 2020-01-19 DIAGNOSIS — R6889 Other general symptoms and signs: Secondary | ICD-10-CM

## 2020-01-19 DIAGNOSIS — R41840 Attention and concentration deficit: Secondary | ICD-10-CM

## 2020-01-19 DIAGNOSIS — E785 Hyperlipidemia, unspecified: Secondary | ICD-10-CM | POA: Diagnosis not present

## 2020-01-19 DIAGNOSIS — E66811 Obesity, class 1: Secondary | ICD-10-CM

## 2020-01-19 MED ORDER — MELOXICAM 7.5 MG PO TABS: 7.5000 mg | ORAL_TABLET | Freq: Every day | ORAL | 0 refills | Status: DC

## 2020-01-19 MED ORDER — CICLOPIROX 8 % EX SOLN: Freq: Every day | CUTANEOUS | 1 refills | Status: DC

## 2020-01-19 NOTE — Progress Notes (Addendum)
   Subjective:    Patient ID: Catherine Adams, female    DOB: 09/04/1975, 44 y.o.   MRN: [id number]  Patient presents for R Foot Pain (plantar fascitits)   Pt here with right heel pain since Sept , no particular injury, also noticed some swelling at her ankles. No meds taken worse first thing in AM but hurts throughout the day, she stands as a Research scientist (medical), she has had swelling in ankles on and off for quite some time but not along with heel pain    Nail fungus she wants to proceed with topical treatment  End of visist state since she came off soda, she has more difficulty concentrating and staying focused on task, family has noticed. Her brother has ADD she believes concern she may have this. She does have anxiety but trying not to rely on her benzo     Review Of Systems:  GEN- denies fatigue, fever, weight loss,weakness, recent illness HEENT- denies eye drainage, change in vision, nasal discharge, CVS- denies chest pain, palpitations RESP- denies SOB, cough, wheeze ABD- denies N/V, change in stools, abd pain GU- denies dysuria, hematuria, dribbling, incontinence MSK- + joint pain, muscle aches, injury Neuro- denies headache, dizziness, syncope, seizure activity       Objective:    BP 112/64   Pulse 84   Temp 98.4 F (36.9 C) (Temporal)   Resp 16   Ht 5\' 2"  (1.575 m)   Wt 175 lb (79.4 kg)   SpO2 98%   BMI 32.01 kg/m  GEN- NAD, alert and oriented x3 CVS- RRR, no murmur RESP-CTAB Psych normal affect and mood EXT- trace ankle -lateral edema  TTP base of heel, NT at plantar fascia- Right foot, no swelling, no gross abnormality Pulses- Radial, DP- 2+        Assessment & Plan:    Pt given 2 adult questionaires for ADHD symptoms and mood/ function screen She will return for visit to discuss, labs to be obtained as well    Problem List Items Addressed This Visit      Unprioritized   Class 1 obesity   Relevant Orders   Hemoglobin A1c   Hyperlipidemia   Relevant  Orders   Lipid panel   Thyroid nodule   Relevant Orders   TSH   T3, free   T4, free    Other Visit Diagnoses    Heel spur, right    -  Primary   concern for spur or OA , obtain xray of foot, given mobic, elevate feet after work   Relevant Orders   DG Foot Complete Right   Ankle swelling, unspecified laterality       Relevant Orders   DG Foot Complete Right   CBC with Differential/Platelet   Comprehensive metabolic panel   Other general symptoms and signs       Relevant Orders   Vitamin B12   Attention and concentration deficit       Toenail fungus       Penlac prescribed, did not tolerate oral terbinafine   Relevant Medications   ciclopirox (PENLAC) 8 % solution      Note: This dictation was prepared with Dragon dictation along with smaller phrase technology. Any transcriptional errors that result from this process are unintentional.

## 2020-01-19 NOTE — Patient Instructions (Addendum)
[facility] [address] [place]  [address], they are open between 8-5pm   F/U 3-4 WEEKS for F/U on Concentration Lab visit before appointment

## 2020-01-21 ENCOUNTER — Other Ambulatory Visit: Payer: Self-pay

## 2020-01-21 ENCOUNTER — Ambulatory Visit
Admission: RE | Admit: 2020-01-21 | Discharge: 2020-01-21 | Disposition: A | Discharge status: Home / Self Care | Payer: 59 | Source: Ambulatory Visit | Attending: Family Medicine | Admitting: Family Medicine

## 2020-01-21 ENCOUNTER — Other Ambulatory Visit: Payer: 59

## 2020-01-21 DIAGNOSIS — M7731 Calcaneal spur, right foot: Secondary | ICD-10-CM

## 2020-01-21 DIAGNOSIS — R6889 Other general symptoms and signs: Secondary | ICD-10-CM

## 2020-01-21 DIAGNOSIS — E041 Nontoxic single thyroid nodule: Secondary | ICD-10-CM

## 2020-01-21 DIAGNOSIS — M25473 Effusion, unspecified ankle: Secondary | ICD-10-CM

## 2020-01-21 DIAGNOSIS — E669 Obesity, unspecified: Secondary | ICD-10-CM

## 2020-01-21 DIAGNOSIS — E785 Hyperlipidemia, unspecified: Secondary | ICD-10-CM

## 2020-01-22 LAB — LIPID PANEL
Cholesterol: 199 mg/dL (ref <200)
HDL: 55 mg/dL (ref >=50)
LDL Cholesterol (Calc): 127 mg/dL (calc) — ABNORMAL HIGH
Non-HDL Cholesterol (Calc): 144 mg/dL (calc) — ABNORMAL HIGH (ref <130)
Total CHOL/HDL Ratio: 3.6 (calc) (ref <5.0)
Triglycerides: 75 mg/dL (ref <150)

## 2020-01-22 LAB — COMPREHENSIVE METABOLIC PANEL
AG Ratio: 1.7 (calc) (ref 1.0–2.5)
ALT: 11 U/L (ref 6–29)
AST: 12 U/L (ref 10–30)
Albumin: 4 g/dL (ref 3.6–5.1)
Alkaline phosphatase (APISO): 45 U/L (ref 31–125)
BUN: 12 mg/dL (ref 7–25)
CO2: 24 mmol/L (ref 20–32)
Calcium: 9 mg/dL (ref 8.6–10.2)
Chloride: 107 mmol/L (ref 98–110)
Creat: 0.59 mg/dL (ref 0.50–1.10)
Globulin: 2.3 g/dL (calc) (ref 1.9–3.7)
Glucose, Bld: 95 mg/dL (ref 65–99)
Potassium: 4.5 mmol/L (ref 3.5–5.3)
Sodium: 139 mmol/L (ref 135–146)
Total Bilirubin: 0.7 mg/dL (ref 0.2–1.2)
Total Protein: 6.3 g/dL (ref 6.1–8.1)

## 2020-01-22 LAB — CBC WITH DIFFERENTIAL/PLATELET
Absolute Monocytes: 701 cells/uL (ref 200–950)
Basophils Absolute: 31 cells/uL (ref 0–200)
Basophils Relative: 0.4 %
Eosinophils Absolute: 123 cells/uL (ref 15–500)
Eosinophils Relative: 1.6 %
HCT: 40 % (ref 35.0–45.0)
Hemoglobin: 13.8 g/dL (ref 11.7–15.5)
Lymphs Abs: 2017 cells/uL (ref 850–3900)
MCH: 31.4 pg (ref 27.0–33.0)
MCHC: 34.5 g/dL (ref 32.0–36.0)
MCV: 91.1 fL (ref 80.0–100.0)
MPV: 11.1 fL (ref 7.5–12.5)
Monocytes Relative: 9.1 %
Neutro Abs: 4828 cells/uL (ref 1500–7800)
Neutrophils Relative %: 62.7 %
Platelets: 237 10*3/uL (ref 140–400)
RBC: 4.39 10*6/uL (ref 3.80–5.10)
RDW: 12.2 % (ref 11.0–15.0)
Total Lymphocyte: 26.2 %
WBC: 7.7 10*3/uL (ref 3.8–10.8)

## 2020-01-22 LAB — T3, FREE: T3, Free: 3.3 pg/mL (ref 2.3–4.2)

## 2020-01-22 LAB — HEMOGLOBIN A1C
Hgb A1c MFr Bld: 4.9 % of total Hgb (ref <5.7)
Mean Plasma Glucose: 94 (calc)
eAG (mmol/L): 5.2 (calc)

## 2020-01-22 LAB — TSH: TSH: 0.98 mIU/L

## 2020-01-22 LAB — VITAMIN B12: Vitamin B-12: 230 pg/mL (ref 200–1100)

## 2020-01-22 LAB — T4, FREE: Free T4: 1.2 ng/dL (ref 0.8–1.8)

## 2020-01-28 ENCOUNTER — Other Ambulatory Visit: Payer: Self-pay | Admitting: *Deleted

## 2020-01-28 DIAGNOSIS — M7731 Calcaneal spur, right foot: Secondary | ICD-10-CM

## 2020-01-31 ENCOUNTER — Telehealth: Payer: Self-pay

## 2020-01-31 NOTE — Telephone Encounter (Signed)
Pt wants to be seen at the [facility] clinic -Dr Excell Seltzer in [place]

## 2020-02-09 ENCOUNTER — Encounter: Payer: Self-pay | Admitting: Family Medicine

## 2020-02-09 ENCOUNTER — Other Ambulatory Visit: Payer: Self-pay

## 2020-02-09 ENCOUNTER — Ambulatory Visit: Payer: 59 | Admitting: Family Medicine

## 2020-02-09 VITALS — BP 110/70 | HR 78 | Temp 98.5°F | Ht 62.0 in | Wt 176.0 lb

## 2020-02-09 DIAGNOSIS — E538 Deficiency of other specified B group vitamins: Secondary | ICD-10-CM

## 2020-02-09 DIAGNOSIS — F411 Generalized anxiety disorder: Secondary | ICD-10-CM | POA: Diagnosis not present

## 2020-02-09 MED ORDER — ESCITALOPRAM OXALATE 5 MG PO TABS: 5.0000 mg | ORAL_TABLET | Freq: Every day | ORAL | 2 refills | Status: DC

## 2020-02-09 NOTE — Assessment & Plan Note (Signed)
I think her anxiety and irritability and mood issues are the cause of her concentration and focus problems.  I think we could treat the underlying mood disorder first.  We will start Lexapro 5 mg at bedtime.  She does have alprazolam but does not take this very often.  We discussed psychotherapy but she states that she does not think that she would do it but will think about it.  Follow her up in a few weeks with regards to medications discussed the side effects of the meds.  Her B12 deficiency she is on low end of normal range especially at her age.  Recommend she start oral B12 1000 mg once a day.

## 2020-02-09 NOTE — Progress Notes (Signed)
   Subjective:    Patient ID: Catherine Adams, female    DOB: 05-02-1975, 44 y.o.   MRN: [id number]  Patient presents for Follow-up (3 weeks) Patient here for interim follow-up.  At her last visit and she was concerned about her mood and her concentration.  He tells me today that she has been working with her GYN they have been trying to get her on a medication to help with her mood swings tearful episodes and feeling irritable all the time.  She states that this does interfere with her daily activities.  She is trying to keep her household together as well as work full-time and do everything for everyone.  She feels exhausted at times and very frustrated.  She did complete an adult attention deficit form and noted that she had high anxiety.  She had recent labs done thyroid function studies came back normal however her B12 level was on the low end.    Review Of Systems:  GEN- + fatigue,denies fever, weight loss,weakness, recent illness HEENT- denies eye drainage, change in vision, nasal discharge, CVS- denies chest pain, palpitations RESP- denies SOB, cough, wheeze ABD- denies N/V, change in stools, abd pain GU- denies dysuria, hematuria, dribbling, incontinence MSK- denies joint pain, muscle aches, injury Neuro- denies headache, dizziness, syncope, seizure activity       Objective:    BP 110/70   Pulse 78   Temp 98.5 F (36.9 C) (Oral)   Ht 5\' 2"  (1.575 m)   Wt 176 lb (79.8 kg)   SpO2 98%   BMI 32.19 kg/m  GEN- NAD, alert and oriented x3 Psych tearful throughout visit, good eye contact, well groomed, no hallucinations, normal thought process         Assessment & Plan:      Problem List Items Addressed This Visit      Unprioritized   GAD (generalized anxiety disorder)    I think her anxiety and irritability and mood issues are the cause of her concentration and focus problems.  I think we could treat the underlying mood disorder first.  We will start Lexapro 5 mg at  bedtime.  She does have alprazolam but does not take this very often.  We discussed psychotherapy but she states that she does not think that she would do it but will think about it.  Follow her up in a few weeks with regards to medications discussed the side effects of the meds.  Her B12 deficiency she is on low end of normal range especially at her age.  Recommend she start oral B12 1000 mg once a day.      Relevant Medications   escitalopram (LEXAPRO) 5 MG tablet    Other Visit Diagnoses    B12 deficiency    -  Primary      Note: This dictation was prepared with Dragon dictation along with smaller phrase technology. Any transcriptional errors that result from this process are unintentional.

## 2020-02-09 NOTE — Patient Instructions (Addendum)
Start lexapro 5mg  at bedtime Continue xanax  Start B12 supplement 1000mg  once a day  F/U 1 month

## 2020-02-10 ENCOUNTER — Ambulatory Visit: Payer: 59 | Admitting: Podiatry

## 2020-02-10 ENCOUNTER — Other Ambulatory Visit: Payer: Self-pay | Admitting: Family Medicine

## 2020-03-03 ENCOUNTER — Other Ambulatory Visit: Payer: Self-pay | Admitting: Family Medicine

## 2020-03-13 ENCOUNTER — Encounter: Payer: Self-pay | Admitting: Family Medicine

## 2020-03-13 ENCOUNTER — Ambulatory Visit: Payer: 59 | Admitting: Family Medicine

## 2020-03-13 ENCOUNTER — Other Ambulatory Visit: Payer: Self-pay

## 2020-03-13 VITALS — BP 112/64 | HR 78 | Temp 98.3°F | Resp 14 | Ht 62.0 in | Wt 178.0 lb

## 2020-03-13 DIAGNOSIS — F411 Generalized anxiety disorder: Secondary | ICD-10-CM | POA: Diagnosis not present

## 2020-03-13 MED ORDER — BUSPIRONE HCL 5 MG PO TABS: 5.0000 mg | ORAL_TABLET | Freq: Two times a day (BID) | ORAL | 2 refills | Status: DC

## 2020-03-13 NOTE — Progress Notes (Signed)
   Subjective:    Patient ID: Catherine Adams, female    DOB: 1975/04/06, 45 y.o.   MRN: [id number]  Patient presents for Follow-up (Lexapro/)   Pt here for intermin follow on lexapro. Started on 12/22 due to stressors and irritability, she was already managing alprazolam but did not take when she started the lexapro She noticed she felt very mellowed out in her family members and friends can tell this.  However she was asymptomatic times she states that she was taking all kinds of things.  She has a cough which but never took them would not come in for anything happens.  When driving she was speeding and not following traffic rules She had vision changes, things were blurry, and she had sexual side effects She stopped the meds around the same time she was diagnosed with COVID-19 Jan13the since then has been okay and symptoms resolved. She did like the mellow feel and it helped anxiety but not the SE, wanted to try another option     Review Of Systems:  GEN- denies fatigue, fever, weight loss,weakness, recent illness HEENT- denies eye drainage, change in vision, nasal discharge, CVS- denies chest pain, palpitations RESP- denies SOB, cough, wheeze ABD- denies N/V, change in stools, abd pain GU- denies dysuria, hematuria, dribbling, incontinence MSK- denies joint pain, muscle aches, injury Neuro- denies headache, dizziness, syncope, seizure activity       Objective:    BP 112/64   Pulse 78   Temp 98.3 F (36.8 C) (Temporal)   Resp 14   Ht 5\' 2"  (1.575 m)   Wt 178 lb (80.7 kg)   SpO2 99%   BMI 32.56 kg/m  GEN- NAD, alert and oriented x3 HEENT- PERRL, EOMI, non injected sclera, pink conjunctiva, MMM, oropharynx clear Neck- Supple, no thyromegaly CVS- RRR, no murmur RESP-CTAB Psych normal affect and mood, PHQ score  5, sleeping more, GAD 7 score 0  EXT- No edema Pulses- Radial, DP- 2+        Assessment & Plan:      Problem List Items Addressed This Visit       Unprioritized   GAD (generalized anxiety disorder) - Primary    After further discussion, will try her on buspar to help with anxiety, and it has less sexual SE, weight gain compared to others She wants to limit use of xanax as well  She will call if she has any SE with the medication  F/u 1 month      Relevant Medications   busPIRone (BUSPAR) 5 MG tablet      Note: This dictation was prepared with Dragon dictation along with smaller phrase technology. Any transcriptional errors that result from this process are unintentional.

## 2020-03-13 NOTE — Patient Instructions (Addendum)
Buspar ( Buspirone) 5mg   twice a day for anxiety F/U 1 months

## 2020-03-13 NOTE — Assessment & Plan Note (Signed)
After further discussion, will try her on buspar to help with anxiety, and it has less sexual SE, weight gain compared to others She wants to limit use of xanax as well  She will call if she has any SE with the medication  F/u 1 month

## 2020-03-31 ENCOUNTER — Encounter: Payer: Self-pay | Admitting: Family Medicine

## 2020-04-06 ENCOUNTER — Other Ambulatory Visit: Payer: Self-pay | Admitting: Family Medicine

## 2020-05-10 ENCOUNTER — Other Ambulatory Visit: Payer: Self-pay

## 2020-05-10 MED ORDER — ALPRAZOLAM 0.25 MG PO TABS: 0.2500 mg | ORAL_TABLET | Freq: Every evening | ORAL | 0 refills | Status: DC | PRN

## 2020-05-10 NOTE — Telephone Encounter (Signed)
Ok to refill??  Last office visit 03/13/2020.  Last refill 07/09/2019.

## 2020-05-10 NOTE — Telephone Encounter (Signed)
Patient called need med refill  ALPRAZolam Prudy Feeler) 0.25 MG tablet  Pharmacy [facility] [phone] [place], [place] - [address]  [address] [place] [address]  Phone:  [phone] Fax:  [phone]

## 2020-05-11 ENCOUNTER — Encounter: Payer: Self-pay | Admitting: *Deleted

## 2020-05-18 ENCOUNTER — Other Ambulatory Visit: Payer: Self-pay

## 2020-05-19 ENCOUNTER — Ambulatory Visit (INDEPENDENT_AMBULATORY_CARE_PROVIDER_SITE_OTHER): Payer: 59 | Admitting: Nurse Practitioner

## 2020-05-19 ENCOUNTER — Encounter: Payer: Self-pay | Admitting: Nurse Practitioner

## 2020-05-19 ENCOUNTER — Other Ambulatory Visit: Payer: Self-pay

## 2020-05-19 VITALS — HR 75

## 2020-05-19 DIAGNOSIS — J029 Acute pharyngitis, unspecified: Secondary | ICD-10-CM

## 2020-05-19 MED ORDER — FLUTICASONE PROPIONATE 50 MCG/ACT NA SUSP: 2.0000 | Freq: Every day | NASAL | 3 refills | Status: DC

## 2020-05-19 NOTE — Progress Notes (Signed)
Subjective:    Patient ID: Catherine Adams, female    DOB: 08-08-1975, 45 y.o.   MRN: [id number]  HPI: Catherine Adams is a 45 y.o. female presenting via parking lot visit due to COVID-19 pandemic for sore throat.  Chief Complaint  Patient presents with  . Sore Throat   SORE THROAT Onset: Wednesday afternoon COVID vaccine status: not vaccinated and not interested in receiving vaccine COVID testing history: not tested for this occurrence. Fever: no Cough: no Shortness of breath: no Wheezing: no Chest pain: no Chest tightness: no Chest congestion: no Nasal congestion: yes Runny nose: yes Post nasal drip: yes Sneezing: no Sore throat: yes; noticed a white spot in the back of her throat Swollen glands: yes Sinus pressure: no Headache: no Face pain: no Toothache: no Ear pain: no  Ear pressure: no  Eyes red/itching:no Eye drainage/crusting: no  Nausea: no  Vomiting: no Diarrhea: no  Change in appetite: no  Loss of taste/smell: no  Rash: no Fatigue: no Sick contacts: no Strep contacts: no  Context: better Recurrent sinusitis: no Treatments attempted: claritin Relief with OTC medications: yes  Allergies  Allergen Reactions  . Codeine     REACTION: N\T\V  . Lamisil [Terbinafine]   . Lexapro [Escitalopram]     Vision changes /spaced out   . Monistat [Miconazole Nitrate-Wipes]     Outpatient Encounter Medications as of 05/19/2020  Medication Sig  . fluticasone (FLONASE) 50 MCG/ACT nasal spray Place 2 sprays into both nostrils daily.  Marland Kitchen ALPRAZolam (XANAX) 0.25 MG tablet Take 1 tablet (0.25 mg total) by mouth at bedtime as needed.  . ciclopirox (PENLAC) 8 % solution Apply topically at bedtime. Apply over nail and surrounding skin. Apply daily over previous coat. After seven (7) days, may remove with alcohol and continue cycle.  . cyclobenzaprine (FLEXERIL) 10 MG tablet TAKE 1 TABLET BY MOUTH THREE TIMES A DAY AS NEEDED FOR MUSCLE SPASMS  . pantoprazole  (PROTONIX) 40 MG tablet TAKE 1 TABLET BY MOUTH EVERY DAY   No facility-administered encounter medications on file as of 05/19/2020.    Patient Active Problem List   Diagnosis Date Noted  . Chronic diarrhea 07/09/2019  . Class 1 obesity 07/09/2019  . Hyperlipidemia 07/07/2018  . GERD (gastroesophageal reflux disease) 05/27/2017  . Thyroid nodule 05/27/2017  . Abdominal pain, unspecified site 07/29/2012  . GAD (generalized anxiety disorder)     Past Medical History:  Diagnosis Date  . Anxiety     Relevant past medical, surgical, family and social history reviewed and updated as indicated. Interim medical history since our last visit reviewed.  Review of Systems Per HPI unless specifically indicated above     Objective:    Pulse 75   SpO2 98%   Wt Readings from Last 3 Encounters:  03/13/20 178 lb (80.7 kg)  02/09/20 176 lb (79.8 kg)  01/19/20 175 lb (79.4 kg)    Physical Exam Vitals reviewed.  Constitutional:      General: She is not in acute distress.    Appearance: She is well-developed. She is not toxic-appearing.  HENT:     Head: Normocephalic and atraumatic.     Nose: No congestion.     Mouth/Throat:     Mouth: Mucous membranes are moist.     Pharynx: Oropharynx is clear.     Tonsils: No tonsillar exudate or tonsillar abscesses.     Comments: Small, circular lesion noted to right tonsil consistent with tonsillolith Eyes:  Extraocular Movements:     Right eye: Normal extraocular motion.     Left eye: Normal extraocular motion.  Cardiovascular:     Rate and Rhythm: Normal rate and regular rhythm.     Heart sounds: Normal heart sounds.  Pulmonary:     Effort: Pulmonary effort is normal. No respiratory distress.     Breath sounds: Normal breath sounds. No wheezing, rhonchi or rales.  Musculoskeletal:     Cervical back: Normal range of motion.  Lymphadenopathy:     Cervical: No cervical adenopathy.  Skin:    General: Skin is warm and dry.     Capillary  Refill: Capillary refill takes less than 2 seconds.     Coloration: Skin is not pale.     Findings: No erythema.  Neurological:     Mental Status: She is alert and oriented to person, place, and time.  Psychiatric:        Mood and Affect: Mood normal.        Behavior: Behavior normal.        Assessment & Plan:  1. Sore throat Acute.  Question URI vs. Allergic rhinitis.  COVID testing obtained.  For now, continue supportive care with daily antihistamine, start daily Flonase nasal spray, plenty of hydration with water, and rest.  Continue supportive care for tonsillolith and if it enlarges or persists, return to clinic.    - SARS-COV-2 RNA,(COVID-19) QUAL NAAT - fluticasone (FLONASE) 50 MCG/ACT nasal spray; Place 2 sprays into both nostrils daily.  Dispense: 16 g; Refill: 3    Follow up plan: Return if symptoms worsen or fail to improve.

## 2020-05-20 LAB — SARS-COV-2 RNA,(COVID-19) QUALITATIVE NAAT: SARS CoV2 RNA: NOT DETECTED

## 2020-09-11 ENCOUNTER — Other Ambulatory Visit: Payer: Self-pay

## 2020-09-11 ENCOUNTER — Other Ambulatory Visit: Payer: 59

## 2020-09-11 DIAGNOSIS — E785 Hyperlipidemia, unspecified: Secondary | ICD-10-CM

## 2020-09-11 DIAGNOSIS — E538 Deficiency of other specified B group vitamins: Secondary | ICD-10-CM

## 2020-09-12 LAB — VITAMIN B12: Vitamin B-12: 610 pg/mL (ref 200–1100)

## 2020-09-14 ENCOUNTER — Ambulatory Visit (INDEPENDENT_AMBULATORY_CARE_PROVIDER_SITE_OTHER): Payer: 59 | Admitting: Nurse Practitioner

## 2020-09-14 ENCOUNTER — Other Ambulatory Visit: Payer: Self-pay

## 2020-09-14 ENCOUNTER — Encounter: Payer: Self-pay | Admitting: Nurse Practitioner

## 2020-09-14 VITALS — BP 118/78 | HR 77 | Temp 98.7°F | Ht 62.0 in | Wt 178.2 lb

## 2020-09-14 DIAGNOSIS — E041 Nontoxic single thyroid nodule: Secondary | ICD-10-CM

## 2020-09-14 DIAGNOSIS — E785 Hyperlipidemia, unspecified: Secondary | ICD-10-CM | POA: Diagnosis not present

## 2020-09-14 DIAGNOSIS — Z889 Allergy status to unspecified drugs, medicaments and biological substances status: Secondary | ICD-10-CM

## 2020-09-14 DIAGNOSIS — Z0001 Encounter for general adult medical examination with abnormal findings: Secondary | ICD-10-CM

## 2020-09-14 DIAGNOSIS — Z79899 Other long term (current) drug therapy: Secondary | ICD-10-CM

## 2020-09-14 DIAGNOSIS — K219 Gastro-esophageal reflux disease without esophagitis: Secondary | ICD-10-CM | POA: Diagnosis not present

## 2020-09-14 DIAGNOSIS — Z0000 Encounter for general adult medical examination without abnormal findings: Secondary | ICD-10-CM

## 2020-09-14 DIAGNOSIS — M545 Low back pain, unspecified: Secondary | ICD-10-CM

## 2020-09-14 DIAGNOSIS — F411 Generalized anxiety disorder: Secondary | ICD-10-CM | POA: Diagnosis not present

## 2020-09-14 MED ORDER — PANTOPRAZOLE SODIUM 40 MG PO TBEC: 40.0000 mg | DELAYED_RELEASE_TABLET | Freq: Every day | ORAL | 1 refills | Status: DC

## 2020-09-14 MED ORDER — PANTOPRAZOLE SODIUM 40 MG PO TBEC: 40.0000 mg | DELAYED_RELEASE_TABLET | Freq: Every day | ORAL | 1 refills | Status: AC

## 2020-09-14 MED ORDER — ALPRAZOLAM 0.25 MG PO TABS: 0.2500 mg | ORAL_TABLET | Freq: Every evening | ORAL | 0 refills | Status: DC | PRN

## 2020-09-14 MED ORDER — CYCLOBENZAPRINE HCL 10 MG PO TABS: 10.0000 mg | ORAL_TABLET | Freq: Every evening | ORAL | 2 refills | Status: AC | PRN

## 2020-09-14 MED ORDER — FLUTICASONE PROPIONATE 50 MCG/ACT NA SUSP: 2.0000 | Freq: Every day | NASAL | 3 refills | Status: AC

## 2020-09-14 NOTE — Progress Notes (Signed)
BP 118/78   Pulse 77   Temp 98.7 F (37.1 C)   Ht _0  (1.575 m)   Wt 178 lb 3.2 oz (80.8 kg)   LMP 08/31/2020   SpO2 100%   BMI 32.59 kg/m    Subjective:    Patient ID: Catherine Adams, female    DOB: [birth year], 45 y.o.   MRN: [id number]  HPI: Catherine Adams is a 45 y.o. female presenting on 09/14/2020 for comprehensive medical examination. Current medical complaints include: none  Sees OB/GYN for mammogram and pap smear.   She currently lives with: husband and 2 kids;  LMP: 08/31/2020; regular; no heavy bleeding  Some days she feels like she explodes for small things.  She talks about her mood every day.  Takes Xanax 0.25 mg as needed - once a week or so.    Jerrye Bushy - forgets to take protonix sometimes. Red sauce is trigger.  In general, tries to avoid red sauce.   Goes Friday to see foot doctor - gets shot every 3 months.    Depression Screen done today and results listed below:  Depression screen [facility] 2/9 09/16/2020 03/13/2020 07/09/2019 07/07/2018 05/27/2017  Decreased Interest 0 1 0 0 0  Down, Depressed, Hopeless 0 0 0 0 0  PHQ - 2 Score 0 1 0 0 0  Altered sleeping 0 3 0 - -  Tired, decreased energy 3 1 0 - -  Change in appetite 1 0 0 - -  Feeling bad or failure about yourself  0 0 0 - -  Trouble concentrating 0 0 0 - -  Moving slowly or fidgety/restless 0 0 0 - -  Suicidal thoughts 0 0 0 - -  PHQ-9 Score 4 5 0 - -  Difficult doing work/chores Not difficult at all Not difficult at all Not difficult at all - -   The patient does not have a history of falls. I did not complete a risk assessment for falls. A plan of care for falls was not documented.   Past Medical History:  Past Medical History:  Diagnosis Date   Anxiety     Surgical History:  Past Surgical History:  Procedure Laterality Date   TUBAL LIGATION      Medications:  Current Outpatient Medications on File Prior to Visit  Medication Sig   meloxicam (MOBIC) 15 MG tablet Take 15 mg by mouth daily.    No current facility-administered medications on file prior to visit.    Allergies:  Allergies  Allergen Reactions   Codeine     REACTION: N\T\V   Lamisil [Terbinafine]    Lexapro [Escitalopram]     Vision changes /spaced out    Monistat [Miconazole Nitrate-Wipes]     Social History:  Social History   Socioeconomic History   Marital status: Married    Spouse name: Not on file   Number of children: Not on file   Years of education: Not on file   Highest education level: Not on file  Occupational History   Not on file  Tobacco Use   Smoking status: Former   Smokeless tobacco: Never  Vaping Use   Vaping Use: Never used  Substance and Sexual Activity   Alcohol use: No   Drug use: No   Sexual activity: Yes    Birth control/protection: Surgical  Other Topics Concern   Not on file  Social History Narrative   Not on file   Social Determinants of Health   Financial  Resource Strain: Not on file  Food Insecurity: Not on file  Transportation Needs: Not on file  Physical Activity: Not on file  Stress: Not on file  Social Connections: Not on file  Intimate Partner Violence: Not on file   Social History   Tobacco Use  Smoking Status Former  Smokeless Tobacco Never   Social History   Substance and Sexual Activity  Alcohol Use No    Family History:  Family History  Problem Relation Age of Onset   Hypertension Mother    Heart disease Father 44       CAD   Hypertension Father    ADD / ADHD Brother    Drug abuse Brother    Cancer Maternal Grandmother        breast cancer     Past medical history, surgical history, medications, allergies, family history and social history reviewed with patient today and changes made to appropriate areas of the chart.   Review of Systems  Constitutional: Negative.   HENT: Negative.    Eyes: Negative.   Respiratory: Negative.    Cardiovascular: Negative.   Gastrointestinal: Negative.   Genitourinary: Negative.    Musculoskeletal: Negative.   Skin: Negative.   Neurological: Negative.   Psychiatric/Behavioral: Negative.        Objective:    BP 118/78   Pulse 77   Temp 98.7 F (37.1 C)   Ht _0  (1.575 m)   Wt 178 lb 3.2 oz (80.8 kg)   LMP 08/31/2020   SpO2 100%   BMI 32.59 kg/m   Wt Readings from Last 3 Encounters:  09/14/20 178 lb 3.2 oz (80.8 kg)  03/13/20 178 lb (80.7 kg)  02/09/20 176 lb (79.8 kg)    Physical Exam Vitals and nursing note reviewed.  Constitutional:      General: She is not in acute distress.    Appearance: Normal appearance. She is normal weight. She is not ill-appearing or toxic-appearing.  HENT:     Head: Normocephalic and atraumatic.     Right Ear: Tympanic membrane, ear canal and external ear normal.     Left Ear: Tympanic membrane, ear canal and external ear normal.     Nose: Nose normal. No congestion or rhinorrhea.     Mouth/Throat:     Mouth: Mucous membranes are moist.     Pharynx: Oropharynx is clear. No oropharyngeal exudate.  Eyes:     General: No scleral icterus.    Extraocular Movements: Extraocular movements intact.     Pupils: Pupils are equal, round, and reactive to light.  Cardiovascular:     Rate and Rhythm: Normal rate and regular rhythm.     Pulses: Normal pulses.     Heart sounds: Normal heart sounds. No murmur heard. Pulmonary:     Effort: Pulmonary effort is normal. No respiratory distress.     Breath sounds: No wheezing or rhonchi.  Abdominal:     General: Abdomen is flat. Bowel sounds are normal. There is no distension.     Palpations: Abdomen is soft.     Tenderness: There is no abdominal tenderness.  Musculoskeletal:        General: No swelling or tenderness. Normal range of motion.     Cervical back: Normal range of motion and neck supple. No rigidity or tenderness.     Right lower leg: No edema.     Left lower leg: No edema.  Skin:    General: Skin is warm and dry.  Capillary Refill: Capillary refill takes less  than 2 seconds.     Coloration: Skin is not jaundiced or pale.  Neurological:     General: No focal deficit present.     Mental Status: She is alert and oriented to person, place, and time.     Motor: No weakness.     Gait: Gait normal.  Psychiatric:        Mood and Affect: Mood normal.        Behavior: Behavior normal.        Thought Content: Thought content normal.        Judgment: Judgment normal.    Results for orders placed or performed in visit on 09/11/20  COMPLETE METABOLIC PANEL WITH GFR  Result Value Ref Range   Glucose, Bld 106 (H) 65 - 99 mg/dL   BUN 14 7 - 25 mg/dL   Creat 0.61 0.50 - 0.99 mg/dL   eGFR 114 > OR = 60 mL/min/1.27m   BUN/Creatinine Ratio NOT APPLICABLE 6 - 22 (calc)   Sodium 139 135 - 146 mmol/L   Potassium 4.5 3.5 - 5.3 mmol/L   Chloride 110 98 - 110 mmol/L   CO2 23 20 - 32 mmol/L   Calcium 8.3 (L) 8.6 - 10.2 mg/dL   Total Protein 6.1 6.1 - 8.1 g/dL   Albumin 4.0 3.6 - 5.1 g/dL   Globulin 2.1 1.9 - 3.7 g/dL (calc)   AG Ratio 1.9 1.0 - 2.5 (calc)   Total Bilirubin 0.3 0.2 - 1.2 mg/dL   Alkaline phosphatase (APISO) 49 31 - 125 U/L   AST 11 10 - 30 U/L   ALT 11 6 - 29 U/L  Lipid panel  Result Value Ref Range   Cholesterol 171 <200 mg/dL   HDL 54 > OR = 50 mg/dL   Triglycerides 73 <150 mg/dL   LDL Cholesterol (Calc) 101 (H) mg/dL (calc)   Total CHOL/HDL Ratio 3.2 <5.0 (calc)   Non-HDL Cholesterol (Calc) 117 <130 mg/dL (calc)  CBC with Differential/Platelet  Result Value Ref Range   WBC 6.8 3.8 - 10.8 Thousand/uL   RBC 4.22 3.80 - 5.10 Million/uL   Hemoglobin 13.2 11.7 - 15.5 g/dL   HCT 38.7 35.0 - 45.0 %   MCV 91.7 80.0 - 100.0 fL   MCH 31.3 27.0 - 33.0 pg   MCHC 34.1 32.0 - 36.0 g/dL   RDW 12.3 11.0 - 15.0 %   Platelets 238 140 - 400 Thousand/uL   MPV 10.6 7.5 - 12.5 fL   Neutro Abs 3,828 1,500 - 7,800 cells/uL   Lymphs Abs 2,162 850 - 3,900 cells/uL   Absolute Monocytes 626 200 - 950 cells/uL   Eosinophils Absolute 122 15 - 500  cells/uL   Basophils Absolute 61 0 - 200 cells/uL   Neutrophils Relative % 56.3 %   Total Lymphocyte 31.8 %   Monocytes Relative 9.2 %   Eosinophils Relative 1.8 %   Basophils Relative 0.9 %  TSH  Result Value Ref Range   TSH 1.33 mIU/L  Vitamin B12  Result Value Ref Range   Vitamin B-12 610 200 - 1,100 pg/mL  TEST AUTHORIZATION  Result Value Ref Range   TEST NAME: VITAMIN D, 1,25 DIHYDROXY    TEST CODE: [id number]   CLIENT CONTACT: KLearta Codding   REPORT ALWAYS MESSAGE SIGNATURE        Assessment & Plan:   Problem List Items Addressed This Visit       Digestive  GERD (gastroesophageal reflux disease)    Chronic, stable.  Takes Protonix when needed.  Also tries to take before triggers or avoid them.  Refill given.  Recent CBC normal.       Relevant Medications   pantoprazole (PROTONIX) 40 MG tablet     Endocrine   Thyroid nodule    TSH normal, will continue to monitor every 6 -12 months.         Other   Hyperlipidemia    Chronic, improved.  Discussed The 10-year ASCVD risk score Mikey Bussing DC Brooke Bonito., et al., 2013) is: 0.5%   Values used to calculate the score:     Age: 75 years     Sex: Female     Is Non-Hispanic African American: No     Diabetic: No     Tobacco smoker: No     Systolic Blood Pressure: 063 mmHg     Is BP treated: No     HDL Cholesterol: 54 mg/dL     Total Cholesterol: 171 mg/dL Statin not indicated at this time.  Continue low saturated fat diet, follow up in 6 months.        Hx of seasonal allergies   Relevant Medications   fluticasone (FLONASE) 50 MCG/ACT nasal spray   GAD (generalized anxiety disorder)    Chronic, stable.  PHQ-9 and GAD-7 slightly elevated today.  Patient is not interested in daily medication at this time, prefers to continue alprazolam 0.25 mg daily as needed for panic.  Offered counseling, however patient declined for now.  Refill given for alprazolam and controlled substance agreement signed; she is aware she will need OV for  refills.  Plan to follow up in 3-4 months or when getting close to running out of alprazolam.       Relevant Medications   ALPRAZolam (XANAX) 0.25 MG tablet   Controlled substance agreement signed   Acute midline low back pain without sciatica   Relevant Medications   meloxicam (MOBIC) 15 MG tablet   cyclobenzaprine (FLEXERIL) 10 MG tablet   Other Visit Diagnoses     Annual physical exam    -  Primary   Hypocalcemia       Vitamin D level pending        Follow up plan: Return for 3-4 months mood.   LABORATORY TESTING:  - Pap smear: done elsewhere  IMMUNIZATIONS:   - Tdap: Tetanus vaccination status reviewed: last tetanus booster within 10 years. - Influenza: Postponed to flu season - Pneumovax: Not applicable - Prevnar: Not applicable - HPV: Not applicable - Zostavax vaccine: Not applicable - [id number] vaccine: declines  SCREENING: -Mammogram: Done elsewhere  - Colonoscopy: Not applicable  - Bone Density: Not applicable  -Hearing Test: Not applicable  -Spirometry: Not applicable   PATIENT COUNSELING:   Advised to take 1 mg of folate supplement per day if capable of pregnancy.   Sexuality: Discussed sexually transmitted diseases, partner selection, use of condoms, avoidance of unintended pregnancy  and contraceptive alternatives.   Advised to avoid cigarette smoking.  I discussed with the patient that most people either abstain from alcohol or drink within safe limits (<=14/week and <=4 drinks/occasion for males, <=7/weeks and <= 3 drinks/occasion for females) and that the risk for alcohol disorders and other health effects rises proportionally with the number of drinks per week and how often a drinker exceeds daily limits.  Discussed cessation/primary prevention of drug use and availability of treatment for abuse.   Diet: Encouraged to adjust caloric intake  to maintain  or achieve ideal body weight, to reduce intake of dietary saturated fat and total fat, to limit  sodium intake by avoiding high sodium foods and not adding table salt, and to maintain adequate dietary potassium and calcium preferably from fresh fruits, vegetables, and low-fat dairy products.    stressed the importance of regular exercise  Injury prevention: Discussed safety belts, safety helmets, smoke detector, smoking near bedding or upholstery.   Dental health: Discussed importance of regular tooth brushing, flossing, and dental visits.    NEXT PREVENTATIVE PHYSICAL DUE IN 1 YEAR. Return for 3-4 months mood.

## 2020-09-16 DIAGNOSIS — Z79899 Other long term (current) drug therapy: Secondary | ICD-10-CM | POA: Insufficient documentation

## 2020-09-16 DIAGNOSIS — Z889 Allergy status to unspecified drugs, medicaments and biological substances status: Secondary | ICD-10-CM | POA: Insufficient documentation

## 2020-09-16 DIAGNOSIS — M545 Low back pain, unspecified: Secondary | ICD-10-CM | POA: Insufficient documentation

## 2020-09-16 NOTE — Assessment & Plan Note (Addendum)
Chronic, stable.  Takes Protonix when needed.  Also tries to take before triggers or avoid them.  Refill given.  Recent CBC normal.

## 2020-09-16 NOTE — Assessment & Plan Note (Signed)
Chronic, improved.  Discussed The 10-year ASCVD risk score Denman George DC Montez Hageman., et al., 2013) is: 0.5%   Values used to calculate the score:     Age: 45 years     Sex: Female     Is Non-Hispanic African American: No     Diabetic: No     Tobacco smoker: No     Systolic Blood Pressure: 118 mmHg     Is BP treated: No     HDL Cholesterol: 54 mg/dL     Total Cholesterol: 171 mg/dL Statin not indicated at this time.  Continue low saturated fat diet, follow up in 6 months.

## 2020-09-16 NOTE — Assessment & Plan Note (Signed)
Chronic, stable.  PHQ-9 and GAD-7 slightly elevated today.  Patient is not interested in daily medication at this time, prefers to continue alprazolam 0.25 mg daily as needed for panic.  Offered counseling, however patient declined for now.  Refill given for alprazolam and controlled substance agreement signed; she is aware she will need OV for refills.  Plan to follow up in 3-4 months or when getting close to running out of alprazolam.

## 2020-09-16 NOTE — Assessment & Plan Note (Signed)
TSH normal, will continue to monitor every 6 -12 months.

## 2020-09-18 ENCOUNTER — Encounter: Payer: Self-pay | Admitting: Nurse Practitioner

## 2020-09-18 LAB — TEST AUTHORIZATION

## 2020-09-18 LAB — COMPLETE METABOLIC PANEL WITH GFR
AG Ratio: 1.9 (calc) (ref 1.0–2.5)
ALT: 11 U/L (ref 6–29)
AST: 11 U/L (ref 10–30)
Albumin: 4 g/dL (ref 3.6–5.1)
Alkaline phosphatase (APISO): 49 U/L (ref 31–125)
BUN: 14 mg/dL (ref 7–25)
CO2: 23 mmol/L (ref 20–32)
Calcium: 8.3 mg/dL — ABNORMAL LOW (ref 8.6–10.2)
Chloride: 110 mmol/L (ref 98–110)
Creat: 0.61 mg/dL (ref 0.50–0.99)
Globulin: 2.1 g/dL (calc) (ref 1.9–3.7)
Glucose, Bld: 106 mg/dL — ABNORMAL HIGH (ref 65–99)
Potassium: 4.5 mmol/L (ref 3.5–5.3)
Sodium: 139 mmol/L (ref 135–146)
Total Bilirubin: 0.3 mg/dL (ref 0.2–1.2)
Total Protein: 6.1 g/dL (ref 6.1–8.1)
eGFR: 114 mL/min/{1.73_m2} (ref >=60)

## 2020-09-18 LAB — CBC WITH DIFFERENTIAL/PLATELET
Absolute Monocytes: 626 cells/uL (ref 200–950)
Basophils Absolute: 61 cells/uL (ref 0–200)
Basophils Relative: 0.9 %
Eosinophils Absolute: 122 cells/uL (ref 15–500)
Eosinophils Relative: 1.8 %
HCT: 38.7 % (ref 35.0–45.0)
Hemoglobin: 13.2 g/dL (ref 11.7–15.5)
Lymphs Abs: 2162 cells/uL (ref 850–3900)
MCH: 31.3 pg (ref 27.0–33.0)
MCHC: 34.1 g/dL (ref 32.0–36.0)
MCV: 91.7 fL (ref 80.0–100.0)
MPV: 10.6 fL (ref 7.5–12.5)
Monocytes Relative: 9.2 %
Neutro Abs: 3828 cells/uL (ref 1500–7800)
Neutrophils Relative %: 56.3 %
Platelets: 238 10*3/uL (ref 140–400)
RBC: 4.22 10*6/uL (ref 3.80–5.10)
RDW: 12.3 % (ref 11.0–15.0)
Total Lymphocyte: 31.8 %
WBC: 6.8 10*3/uL (ref 3.8–10.8)

## 2020-09-18 LAB — LIPID PANEL
Cholesterol: 171 mg/dL (ref <200)
HDL: 54 mg/dL (ref >=50)
LDL Cholesterol (Calc): 101 mg/dL (calc) — ABNORMAL HIGH
Non-HDL Cholesterol (Calc): 117 mg/dL (calc) (ref <130)
Total CHOL/HDL Ratio: 3.2 (calc) (ref <5.0)
Triglycerides: 73 mg/dL (ref <150)

## 2020-09-18 LAB — VITAMIN D 1,25 DIHYDROXY
Vitamin D 1, 25 (OH)2 Total: 25 pg/mL (ref 18–72)
Vitamin D2 1, 25 (OH)2: <8 pg/mL
Vitamin D3 1, 25 (OH)2: 25 pg/mL

## 2020-09-18 LAB — TSH: TSH: 1.33 mIU/L

## 2020-09-20 ENCOUNTER — Other Ambulatory Visit: Payer: 59

## 2020-09-20 ENCOUNTER — Other Ambulatory Visit: Payer: Self-pay

## 2020-09-21 LAB — PTH, INTACT AND CALCIUM
Calcium: 9.8 mg/dL (ref 8.6–10.2)
PTH: 26 pg/mL (ref 16–77)

## 2020-09-21 LAB — MAGNESIUM: Magnesium: 2.3 mg/dL (ref 1.5–2.5)

## 2020-10-03 LAB — HM MAMMOGRAPHY

## 2020-11-05 ENCOUNTER — Emergency Department (HOSPITAL_COMMUNITY): Payer: 59

## 2020-11-05 ENCOUNTER — Other Ambulatory Visit: Payer: Self-pay

## 2020-11-05 ENCOUNTER — Encounter (HOSPITAL_COMMUNITY): Payer: Self-pay

## 2020-11-05 ENCOUNTER — Emergency Department (HOSPITAL_COMMUNITY)
Admission: EM | Admit: 2020-11-05 | Discharge: 2020-11-06 | Disposition: A | Discharge status: Home / Self Care | Payer: 59 | Attending: Emergency Medicine | Admitting: Emergency Medicine

## 2020-11-05 DIAGNOSIS — W010XXA Fall on same level from slipping, tripping and stumbling without subsequent striking against object, initial encounter: Secondary | ICD-10-CM | POA: Diagnosis not present

## 2020-11-05 DIAGNOSIS — Z87891 Personal history of nicotine dependence: Secondary | ICD-10-CM | POA: Diagnosis not present

## 2020-11-05 DIAGNOSIS — S81812A Laceration without foreign body, left lower leg, initial encounter: Secondary | ICD-10-CM | POA: Diagnosis not present

## 2020-11-05 DIAGNOSIS — S8992XA Unspecified injury of left lower leg, initial encounter: Secondary | ICD-10-CM | POA: Diagnosis present

## 2020-11-05 NOTE — ED Provider Notes (Signed)
Emergency Medicine Provider Triage Evaluation Note  Catherine Adams , a 45 y.o. female  was evaluated in triage.  Pt complains of left lower extremity pain that is achy constant associative 2 small lacerations. She states that she kneeled onto a metal arrow and has 2 puncture marks on her left shin.  She applied pressure to this area and the bleeding slowed down.  She states is achy constant pain however.  States that she has had the vaccine or tetanus within the past 10 years.  Review of Systems  Positive: Left shin laceration Negative: Fever  Physical Exam  There were no vitals taken for this visit. Gen:   Awake, no distress   Resp:  Normal effort  MSK:   Moves extremities without difficulty  Other:  To puncture style lacerations to the anterior left knee/proximal tibia  Medical Decision Making  Medically screening exam initiated at 9:47 PM.  Appropriate orders placed.  Catherine Adams was informed that the remainder of the evaluation will be completed by another provider, this initial triage assessment does not replace that evaluation, and the importance of remaining in the ED until their evaluation is complete.  Patient is distally neurovascularly intact.  Has 2 small but gaping lacerations.   Gailen Shelter, [place] 11/05/20 2150    Terrilee Files, MD 11/06/20 1012

## 2020-11-05 NOTE — ED Triage Notes (Signed)
Pt fell onto an arrow on her L knee, two small lacerations

## 2020-11-06 ENCOUNTER — Telehealth: Payer: Self-pay | Admitting: Nurse Practitioner

## 2020-11-06 NOTE — ED Provider Notes (Signed)
[facility] EMERGENCY DEPARTMENT Provider Note   CSN: [id number] Arrival date & time: 11/05/20  2120     History Chief Complaint  Patient presents with   Laceration    Catherine Adams is a 45 y.o. female with history of anxiety, HLD who presents the emergency department with a chief complaint of left leg injury.  The patient reports that earlier tonight she fell onto the tip of an arrow head, which caused two wounds to her left lower leg.  She is concerned that the tip of the arrowhead went all the way to the bone and she may have a broken bone.  She reports constant pain overlying the wounds.  The pain does not radiate.  Pain is worse with pressure and movement.  No known aggravating or alleviating factors.  She has been ambulatory since the incident.  She denies numbness, weakness, left knee or ankle pain, fever, chills, thigh or calf swelling.  She did not fall to the ground after the incident.  Tdap is up-to-date.  She has not immunocompromised.  Reports that arrow heads were new and had never been used.  The history is provided by medical records and the patient. No language interpreter was used.      Past Medical History:  Diagnosis Date   Anxiety     Patient Active Problem List   Diagnosis Date Noted   Controlled substance agreement signed 09/16/2020   Acute midline low back pain without sciatica 09/16/2020   Hx of seasonal allergies 09/16/2020   Chronic diarrhea 07/09/2019   Class 1 obesity 07/09/2019   Hyperlipidemia 07/07/2018   GERD (gastroesophageal reflux disease) 05/27/2017   Thyroid nodule 05/27/2017   Abdominal pain, unspecified site 07/29/2012   GAD (generalized anxiety disorder)     Past Surgical History:  Procedure Laterality Date   TUBAL LIGATION       OB History   No obstetric history on file.     Family History  Problem Relation Age of Onset   Hypertension Mother    Heart disease Father 29       CAD   Hypertension Father     ADD / ADHD Brother    Drug abuse Brother    Cancer Maternal Grandmother        breast cancer     Social History   Tobacco Use   Smoking status: Former   Smokeless tobacco: Never  Building services engineer Use: Never used  Substance Use Topics   Alcohol use: No   Drug use: No    Home Medications Prior to Admission medications   Medication Sig Start Date End Date Taking? Authorizing Provider  ALPRAZolam (XANAX) 0.25 MG tablet Take 1 tablet (0.25 mg total) by mouth at bedtime as needed. 09/14/20   Valentino Nose, NP  cyclobenzaprine (FLEXERIL) 10 MG tablet Take 1 tablet (10 mg total) by mouth at bedtime as needed for muscle spasms. 09/14/20   Valentino Nose, NP  fluticasone Aleda Grana) 50 MCG/ACT nasal spray Place 2 sprays into both nostrils daily. 09/14/20   Valentino Nose, NP  meloxicam (MOBIC) 15 MG tablet Take 15 mg by mouth daily. 07/28/20   [provider]  pantoprazole (PROTONIX) 40 MG tablet Take 1 tablet (40 mg total) by mouth daily. 09/14/20   Valentino Nose, NP    Allergies    Codeine, Lamisil [terbinafine], Lexapro [escitalopram], and Monistat [miconazole nitrate-wipes]  Review of Systems   Review of Systems  Constitutional:  Negative for activity change, chills and fever.  Respiratory:  Negative for shortness of breath.   Cardiovascular:  Negative for chest pain.  Gastrointestinal:  Negative for abdominal pain, diarrhea, nausea and vomiting.  Musculoskeletal:  Positive for arthralgias and myalgias. Negative for back pain.  Skin:  Positive for wound. Negative for rash.  Neurological:  Negative for seizures, syncope, weakness and light-headedness.   Physical Exam Updated Vital Signs BP 122/89   Pulse 89   Temp 99.1 F (37.3 C) (Oral)   Resp 18   SpO2 98%   Physical Exam Vitals and nursing note reviewed.  Constitutional:      General: She is not in acute distress. HENT:     Head: Normocephalic.  Eyes:     Conjunctiva/sclera:  Conjunctivae normal.  Cardiovascular:     Rate and Rhythm: Normal rate and regular rhythm.     Heart sounds: No murmur heard.   No friction rub. No gallop.  Pulmonary:     Effort: Pulmonary effort is normal. No respiratory distress.  Abdominal:     General: There is no distension.     Palpations: Abdomen is soft.  Musculoskeletal:     Cervical back: Neck supple.     Comments: There are two 1.5 cm lacerations noted to the left lower leg, just inferior to the knee.  Mild oozing from the wound without is more midline with moving the leg.  Wounds are superficial and linear.  No foreign bodies.  DP and PT pulses are 2+ and symmetric.  Sensation is intact and equal to the bilateral lower legs.  Good strength against resistance with dorsiflexion plantarflexion.  No tenderness palpation to the left knee or ankle.  Skin:    General: Skin is warm.     Findings: No rash.  Neurological:     Mental Status: She is alert.  Psychiatric:        Behavior: Behavior normal.     ED Results / Procedures / Treatments   Labs (all labs ordered are listed, but only abnormal results are displayed) Labs Reviewed - No data to display  EKG None  Radiology DG Tibia/Fibula Left  Result Date: 11/05/2020 CLINICAL DATA:  Laceration from middle aero, fall EXAM: LEFT TIBIA AND FIBULA - 2 VIEW COMPARISON:  None. FINDINGS: There is no evidence of fracture or other focal bone lesions. The joint spaces are preserved. Laceration on the anterior superior shin. No radiopaque foreign body. IMPRESSION: No acute fracture.  No radiopaque foreign body. Electronically Signed   By: Wiliam Ke M.D.   On: 11/05/2020 22:24    Procedures .Marland KitchenLaceration Repair  Date/Time: 11/06/2020 1:28 AM Performed by: Barkley Boards, PA-C Authorized by: Barkley Boards, PA-C   Consent:    Consent obtained:  Verbal   Risks discussed:  Infection, pain and poor cosmetic result   Alternatives discussed:  No treatment Universal protocol:     Procedure explained and questions answered to patient or proxy's satisfaction: yes     Relevant documents present and verified: yes     Imaging studies available: yes     Patient identity confirmed:  Verbally with patient and arm band Anesthesia:    Anesthesia method:  None Laceration details:    Location:  Leg   Leg location:  L lower leg   Length (cm):  1.5 Pre-procedure details:    Preparation:  Patient was prepped and draped in usual sterile fashion and imaging obtained to evaluate for foreign bodies Exploration:  Hemostasis achieved with:  Direct pressure   Imaging outcome: foreign body not noted     Wound exploration: wound explored through full range of motion and entire depth of wound visualized     Wound extent: no areolar tissue violation noted, no fascia violation noted, no foreign bodies/material noted, no muscle damage noted, no nerve damage noted, no tendon damage noted, no underlying fracture noted and no vascular damage noted     Contaminated: no   Treatment:    Area cleansed with:  Soap and water   Amount of cleaning:  Standard Skin repair:    Repair method:  Tissue adhesive Approximation:    Approximation:  Close Repair type:    Repair type:  Simple Post-procedure details:    Dressing:  Open (no dressing)   Procedure completion:  Tolerated well, no immediate complications .Marland KitchenLaceration Repair  Date/Time: 11/06/2020 1:29 AM Performed by: Barkley Boards, PA-C Authorized by: Barkley Boards, PA-C   Consent:    Consent obtained:  Verbal   Consent given by:  Patient   Risks, benefits, and alternatives were discussed: yes     Risks discussed:  Infection, poor cosmetic result and pain Universal protocol:    Procedure explained and questions answered to patient or proxy's satisfaction: yes     Relevant documents present and verified: yes     Test results available: no     Imaging studies available: yes     Patient identity confirmed:  Verbally with patient and  arm band Anesthesia:    Anesthesia method:  None Laceration details:    Location:  Leg   Leg location:  L lower leg Pre-procedure details:    Preparation:  Patient was prepped and draped in usual sterile fashion and imaging obtained to evaluate for foreign bodies Exploration:    Hemostasis achieved with:  Direct pressure   Wound extent: no areolar tissue violation noted, no fascia violation noted, no foreign bodies/material noted, no muscle damage noted, no nerve damage noted, no tendon damage noted, no underlying fracture noted and no vascular damage noted     Contaminated: no   Treatment:    Area cleansed with:  Shur-Clens   Amount of cleaning:  Standard Skin repair:    Repair method: Derma-clip. Approximation:    Approximation:  Close Repair type:    Repair type:  Simple Post-procedure details:    Dressing:  Open (no dressing)   Procedure completion:  Tolerated well, no immediate complications   Medications Ordered in ED Medications - No data to display  ED Course  I have reviewed the triage vital signs and the nursing notes.  Pertinent labs & imaging results that were available during my care of the patient were reviewed by me and considered in my medical decision making (see chart for details).    MDM Rules/Calculators/A&P                           45 year old female with a history of hyperlipidemia and anxiety who presents to the emergency department with a chief complaint of laceration secondary to cutting herself with a type of the head of an arrow earlier tonight.  Vital signs are stable.  Imaging has been reviewed and independently interpreted by me.  No fractures noted on x-ray of the tibia and fibula.  She is neurovascularly intact on exam.  Tdap is up-to-date.  Shared decision-making conversation with the patient regarding closure.  The patient adamantly did not  want to have sutures.  Initially attempted Steri-Strips, but the wound closest to the midline of the  left knee had tension to hold with Steri-Strips.  I applied a derma clip device to both wounds.  The more lateral wound held nicely with not too much tension on the wound.  When she bent her knee, the midline wound did not hold despite waiting 20 minutes per device instructions.  At that time, she was agreeable to Dermabond.  Dermabond was placed.  She ambulated without difficulty after placement.  She was advised on home wound care instructions.  Declines pain medication at this time.  She is hemodynamically stable and in no acute distress.  Safer discharge home with outpatient follow-up as needed.  Return precautions given.  Final Clinical Impression(s) / ED Diagnoses Final diagnoses:  Laceration of left lower extremity, initial encounter    Rx / DC Orders ED Discharge Orders     None        Barkley Boards, PA-C 11/06/20 0133    Nira Conn, MD 11/06/20 [phone]

## 2020-11-06 NOTE — Discharge Instructions (Addendum)
Thank you for allowing me to care for you today in the Emergency Department.   In 10 days, you can tag on the edges of the derma clip adhesive to remove it.  Do not fall in the plastic edges.  Tug on the adhesive ends to remove it like a Band-Aid.  The glue on the other wound will fall off on its own.  Try not to pick at it.  You may have more resistance if you try to fully bend your leg.  If you feel as if you have to fully bend it and it is pulling on the wound, you can try applying an Ace wrap, which is available over-the-counter to the wound to help keep from fully bending the leg.  Take 650 mg of Tylenol or 600 mg of ibuprofen with food every 6 hours for pain.  You can alternate between these 2 medications every 3 hours if your pain returns.  For instance, you can take Tylenol at noon, followed by a dose of ibuprofen at 3, followed by second dose of Tylenol and 6.  You elevate your leg at or above the level of your heart to help with pain and swelling.  You can apply an ice pack for 15 to 20 minutes up to 3-4 times a day to help with pain and swelling.  Return to the emergency department if you develop significant swelling, redness, warmth, thick mucus-like drainage, fever, or chills around the wound, if you have another fall or injury, if you have uncontrollable bleeding, or have other new, concerning symptoms.

## 2020-11-06 NOTE — Telephone Encounter (Signed)
Patient called to ask if she's up to date on her tetanus shot. Patient went to ED 9/18. Received dermaclip; patient reported it's open and wants to know if provider will use glue to seal the area.  Please advise at [phone].

## 2020-11-06 NOTE — Telephone Encounter (Signed)
Called and  spoke with patient she was not due for shot pt last  shot was in 2011.

## 2020-12-18 ENCOUNTER — Ambulatory Visit: Payer: 59 | Admitting: Nurse Practitioner

## 2020-12-18 ENCOUNTER — Encounter: Payer: Self-pay | Admitting: Nurse Practitioner

## 2020-12-18 ENCOUNTER — Other Ambulatory Visit: Payer: Self-pay

## 2020-12-18 DIAGNOSIS — F411 Generalized anxiety disorder: Secondary | ICD-10-CM | POA: Diagnosis not present

## 2020-12-18 NOTE — Progress Notes (Signed)
Subjective:    Patient ID: Catherine Adams, female    DOB: 12/19/1975, 45 y.o.   MRN: [id number]  HPI: Catherine Adams is a 45 y.o. female presenting for  Chief Complaint  Patient presents with   Anxiety   ANXIETY/STRESS Patient reports she has continued to used alprazolam 0.25 mg very seldom.  Maybe once or twice per week.  She still has some left from her last prescription.  She reports 1-2 panic episodes since our last visit.  Once when they were visiting the beach, she though she saw an alligator where they were walking and it took a few minutes for her to calm down.  She is still not interested in a daily medication or talking to a counselor at this time.  Depression screen [facility] 2/9 12/18/2020 12/18/2020 09/16/2020 03/13/2020 07/09/2019  Decreased Interest 1 1 0 1 0  Down, Depressed, Hopeless 0 0 0 0 0  PHQ - 2 Score 1 1 0 1 0  Altered sleeping 1 - 0 3 0  Tired, decreased energy 2 - 3 1 0  Change in appetite 2 - 1 0 0  Feeling bad or failure about yourself  0 - 0 0 0  Trouble concentrating 1 - 0 0 0  Moving slowly or fidgety/restless 1 - 0 0 0  Suicidal thoughts 0 - 0 0 0  PHQ-9 Score 8 - 4 5 0  Difficult doing work/chores - - Not difficult at all Not difficult at all Not difficult at all    GAD 7 : Generalized Anxiety Score 12/18/2020 09/16/2020 03/13/2020 07/07/2018  Nervous, Anxious, on Edge 1 2 0 0  Control/stop worrying 1 1 0 0  Worry too much - different things 1 1 0 0  Trouble relaxing 0 0 0 0  Restless 0 0 0 0  Easily annoyed or irritable 1 1 0 0  Afraid - awful might happen 1 0 0 0  Total GAD 7 Score 5 5 0 0  Anxiety Difficulty Not difficult at all Not difficult at all - Not difficult at all   Allergies  Allergen Reactions   Codeine     REACTION: N\T\V   Lamisil [Terbinafine]    Lexapro [Escitalopram]     Vision changes /spaced out    Monistat [Miconazole Nitrate-Wipes]     Outpatient Encounter Medications as of 12/18/2020  Medication Sig   augmented  betamethasone dipropionate (DIPROLENE-AF) 0.05 % cream Apply topically 2 (two) times daily.   cyclobenzaprine (FLEXERIL) 10 MG tablet Take 1 tablet (10 mg total) by mouth at bedtime as needed for muscle spasms.   fluticasone (FLONASE) 50 MCG/ACT nasal spray Place 2 sprays into both nostrils daily.   meloxicam (MOBIC) 15 MG tablet Take 15 mg by mouth daily.   pantoprazole (PROTONIX) 40 MG tablet Take 1 tablet (40 mg total) by mouth daily.   ALPRAZolam (XANAX) 0.25 MG tablet Take 1 tablet (0.25 mg total) by mouth at bedtime as needed.   [DISCONTINUED] ALPRAZolam (XANAX) 0.25 MG tablet Take 1 tablet (0.25 mg total) by mouth at bedtime as needed.   No facility-administered encounter medications on file as of 12/18/2020.    Patient Active Problem List   Diagnosis Date Noted   Controlled substance agreement signed 09/16/2020   Acute midline low back pain without sciatica 09/16/2020   Hx of seasonal allergies 09/16/2020   Chronic diarrhea 07/09/2019   Class 1 obesity 07/09/2019   Hyperlipidemia 07/07/2018   GERD (gastroesophageal reflux disease) 05/27/2017  Thyroid nodule 05/27/2017   Abdominal pain, unspecified site 07/29/2012   GAD (generalized anxiety disorder)     Past Medical History:  Diagnosis Date   Anxiety     Relevant past medical, surgical, family and social history reviewed and updated as indicated. Interim medical history since our last visit reviewed.  Review of Systems Per HPI unless specifically indicated above     Objective:    BP 112/78 (BP Location: Left Arm, Patient Position: Sitting)   Pulse 81   Temp 98.4 F (36.9 C) (Oral)   Resp 18   Ht 5\' 2"  (1.575 m)   Wt 183 lb (83 kg)   LMP 12/06/2020 (Approximate)   SpO2 95%   BMI 33.47 kg/m   Wt Readings from Last 3 Encounters:  12/18/20 183 lb (83 kg)  09/14/20 178 lb 3.2 oz (80.8 kg)  03/13/20 178 lb (80.7 kg)    Physical Exam Vitals and nursing note reviewed.  Constitutional:      General: She is not  in acute distress.    Appearance: Normal appearance. She is not toxic-appearing.  Cardiovascular:     Rate and Rhythm: Normal rate and regular rhythm.     Heart sounds: Normal heart sounds. No murmur heard. Pulmonary:     Effort: Pulmonary effort is normal. No respiratory distress.     Breath sounds: Normal breath sounds. No wheezing, rhonchi or rales.  Skin:    General: Skin is warm and dry.     Coloration: Skin is not jaundiced or pale.     Findings: No erythema.  Neurological:     Mental Status: She is alert and oriented to person, place, and time.  Psychiatric:        Mood and Affect: Mood normal.        Behavior: Behavior normal.        Thought Content: Thought content normal.       Assessment & Plan:   Problem List Items Addressed This Visit       Other   GAD (generalized anxiety disorder)    Chronic.  PHQ-9 slightly elevated, GAD-7 is stable today.  Again, we discussed daily pharmacotherapy with SSRI, however patient is not interested in a daily medication at this time.  She does feel that the alprazolam completely helps with her symptoms.  She is also not interested in counseling at this time, but will let me know if she changes her mind.  No SI or HI today.  PDMP reviewed and appropriate.  Refill given for alprazolam.  We will plan to follow-up in 3 to 6 months to recheck mood.  She is aware office visits are needed for refills of this medication.       Relevant Medications   ALPRAZolam (XANAX) 0.25 MG tablet     Follow up plan: Return for 3-6 months mood, recheck lipids (fasting).

## 2020-12-19 MED ORDER — ALPRAZOLAM 0.25 MG PO TABS: 0.2500 mg | ORAL_TABLET | Freq: Every evening | ORAL | 0 refills | Status: DC | PRN

## 2020-12-19 NOTE — Assessment & Plan Note (Signed)
Chronic.  PHQ-9 slightly elevated, GAD-7 is stable today.  Again, we discussed daily pharmacotherapy with SSRI, however patient is not interested in a daily medication at this time.  She does feel that the alprazolam completely helps with her symptoms.  She is also not interested in counseling at this time, but will let me know if she changes her mind.  No SI or HI today.  PDMP reviewed and appropriate.  Refill given for alprazolam.  We will plan to follow-up in 3 to 6 months to recheck mood.  She is aware office visits are needed for refills of this medication.

## 2021-03-06 ENCOUNTER — Encounter: Payer: Self-pay | Admitting: Podiatry

## 2021-03-07 ENCOUNTER — Other Ambulatory Visit: Payer: Self-pay | Admitting: Podiatry

## 2021-03-08 ENCOUNTER — Other Ambulatory Visit: Payer: Self-pay

## 2021-03-08 ENCOUNTER — Ambulatory Visit: Payer: 59 | Admitting: Nurse Practitioner

## 2021-03-08 ENCOUNTER — Encounter: Payer: Self-pay | Admitting: Nurse Practitioner

## 2021-03-08 VITALS — BP 110/68 | HR 73 | Ht 62.0 in | Wt 182.0 lb

## 2021-03-08 DIAGNOSIS — K219 Gastro-esophageal reflux disease without esophagitis: Secondary | ICD-10-CM | POA: Diagnosis not present

## 2021-03-08 DIAGNOSIS — E041 Nontoxic single thyroid nodule: Secondary | ICD-10-CM | POA: Diagnosis not present

## 2021-03-08 DIAGNOSIS — F411 Generalized anxiety disorder: Secondary | ICD-10-CM

## 2021-03-08 MED ORDER — ALPRAZOLAM 0.25 MG PO TABS: 0.2500 mg | ORAL_TABLET | Freq: Every evening | ORAL | 0 refills | Status: DC | PRN

## 2021-03-08 NOTE — Progress Notes (Signed)
Subjective:    Patient ID: Catherine Adams, female    DOB: 1975-11-04, 46 y.o.   MRN: [id number]  HPI: Catherine Adams is a 46 y.o. female presenting for  Chief Complaint  Patient presents with   Anxiety    ANXIETY/STRESS Xanax is working well - using about 2 times per week.  Tired to do something daily with previous PCP and had significant side effects.  She is not interested in trying an alternate medication at this time.  Depression screen [facility] 2/9 03/08/2021 12/18/2020 12/18/2020 09/16/2020 03/13/2020  Decreased Interest 1 1 1  0 1  Down, Depressed, Hopeless 0 0 0 0 0  PHQ - 2 Score 1 1 1  0 1  Altered sleeping 0 1 - 0 3  Tired, decreased energy 2 2 - 3 1  Change in appetite 2 2 - 1 0  Feeling bad or failure about yourself  0 0 - 0 0  Trouble concentrating 2 1 - 0 0  Moving slowly or fidgety/restless 2 1 - 0 0  Suicidal thoughts 0 0 - 0 0  PHQ-9 Score 9 8 - 4 5  Difficult doing work/chores Not difficult at all - - Not difficult at all Not difficult at all  Some recent data might be hidden    GAD 7 : Generalized Anxiety Score 03/08/2021 12/18/2020 09/16/2020 03/13/2020  Nervous, Anxious, on Edge 2 1 2  0  Control/stop worrying 1 1 1  0  Worry too much - different things 1 1 1  0  Trouble relaxing 0 0 0 0  Restless 0 0 0 0  Easily annoyed or irritable 2 1 1  0  Afraid - awful might happen 0 1 0 0  Total GAD 7 Score 6 5 5  0  Anxiety Difficulty Not difficult at all Not difficult at all Not difficult at all -   GERD Currently taking pantoprazole 40 mg daily as needed.  Does not take every day and works for her after her symptoms start.  She has not been able to determine certain triggers.  GERD control status: stable Satisfied with current treatment? yes Heartburn frequency: not very often Dysphagia: no Odynophagia:  no Hematemesis: no Blood in stool: no EGD: no   Allergies  Allergen Reactions   Codeine     REACTION: N/V   Lamisil [Terbinafine]    Lexapro  [Escitalopram]     Vision changes /spaced out    Monistat [Miconazole Nitrate-Wipes]     Outpatient Encounter Medications as of 03/08/2021  Medication Sig   augmented betamethasone dipropionate (DIPROLENE-AF) 0.05 % cream Apply topically 2 (two) times daily.   cyclobenzaprine (FLEXERIL) 10 MG tablet Take 1 tablet (10 mg total) by mouth at bedtime as needed for muscle spasms.   fluorouracil (EFUDEX) 5 % cream APPLY 1 AS DIRECTED TO SKIN TWICE A DAY APPLY TO AFFECTED AREAS TWICE A DAY FOR 14 DAYS   fluticasone (FLONASE) 50 MCG/ACT nasal spray Place 2 sprays into both nostrils daily.   pantoprazole (PROTONIX) 40 MG tablet Take 1 tablet (40 mg total) by mouth daily.   [DISCONTINUED] ALPRAZolam (XANAX) 0.25 MG tablet Take 1 tablet (0.25 mg total) by mouth at bedtime as needed.   ALPRAZolam (XANAX) 0.25 MG tablet Take 1 tablet (0.25 mg total) by mouth at bedtime as needed.   [DISCONTINUED] meloxicam (MOBIC) 15 MG tablet Take 15 mg by mouth daily. (Patient not taking: Reported on 03/06/2021)   No facility-administered encounter medications on file as of 03/08/2021.  Patient Active Problem List   Diagnosis Date Noted   Controlled substance agreement signed 09/16/2020   Acute midline low back pain without sciatica 09/16/2020   Hx of seasonal allergies 09/16/2020   Chronic diarrhea 07/09/2019   Class 1 obesity 07/09/2019   Hyperlipidemia 07/07/2018   GERD (gastroesophageal reflux disease) 05/27/2017   Thyroid nodule 05/27/2017   Abdominal pain, unspecified site 07/29/2012   GAD (generalized anxiety disorder)     Past Medical History:  Diagnosis Date   Anxiety     Relevant past medical, surgical, family and social history reviewed and updated as indicated. Interim medical history since our last visit reviewed.  Review of Systems Per HPI unless specifically indicated above     Objective:    BP 110/68    Pulse 73    Ht 5\' 2"  (1.575 m)    Wt 182 lb (82.6 kg)    SpO2 97%    BMI 33.29  kg/m   Wt Readings from Last 3 Encounters:  03/08/21 182 lb (82.6 kg)  12/18/20 183 lb (83 kg)  09/14/20 178 lb 3.2 oz (80.8 kg)    Physical Exam Vitals and nursing note reviewed.  Constitutional:      General: She is not in acute distress.    Appearance: Normal appearance. She is not toxic-appearing.  Cardiovascular:     Rate and Rhythm: Normal rate and regular rhythm.     Heart sounds: Normal heart sounds. No murmur heard. Pulmonary:     Effort: Pulmonary effort is normal. No respiratory distress.     Breath sounds: Normal breath sounds. No wheezing, rhonchi or rales.  Abdominal:     General: Abdomen is flat. Bowel sounds are normal. There is no distension.     Palpations: Abdomen is soft. There is no mass.     Tenderness: There is no abdominal tenderness. There is no right CVA tenderness or left CVA tenderness.  Skin:    General: Skin is warm and dry.     Coloration: Skin is not jaundiced or pale.     Findings: No erythema.  Neurological:     Mental Status: She is alert and oriented to person, place, and time.     Motor: No weakness.     Gait: Gait normal.  Psychiatric:        Mood and Affect: Mood normal.        Behavior: Behavior normal.        Thought Content: Thought content normal.        Judgment: Judgment normal.      Assessment & Plan:   Problem List Items Addressed This Visit       Digestive   GERD (gastroesophageal reflux disease)    Chronic.  Stable with protonix 40 mg daily as needed.  No red flag symptoms, continue with as needed use of medication.      Relevant Orders   COMPLETE METABOLIC PANEL WITH GFR     Endocrine   Thyroid nodule    Check TSH today for monitoring.  No new symptoms.      Relevant Orders   TSH     Other   GAD (generalized anxiety disorder) - Primary    Chronic.  PHQ-9 and GAD-7 are stable for the patient.  Again we discussed daily pharmacotherapy, however patient is not interested in daily medication at this time and  fears side effects.  The alprazolam 0.25 mg daily as needed completely helps with her symptoms and she is taking  this a couple of times weekly at the most.  Denies SI/HI today.  PDMP reviewed and appropriate.  Refill given for alprazolam.  Follow-up with new PCP.      Relevant Medications   ALPRAZolam (XANAX) 0.25 MG tablet   Other Relevant Orders   CBC with Differential/Platelet   TSH   COMPLETE METABOLIC PANEL WITH GFR     Follow up plan: Return for with new PCP.

## 2021-03-08 NOTE — Assessment & Plan Note (Signed)
Check TSH today for monitoring.  No new symptoms.

## 2021-03-08 NOTE — Assessment & Plan Note (Signed)
Chronic.  Stable with protonix 40 mg daily as needed.  No red flag symptoms, continue with as needed use of medication.

## 2021-03-08 NOTE — Assessment & Plan Note (Addendum)
Chronic.  PHQ-9 and GAD-7 are stable for the patient.  Again we discussed daily pharmacotherapy, however patient is not interested in daily medication at this time and fears side effects.  The alprazolam 0.25 mg daily as needed completely helps with her symptoms and she is taking this a couple of times weekly at the most.  Denies SI/HI today.  PDMP reviewed and appropriate.  Refill given for alprazolam.  Follow-up with new PCP.

## 2021-03-09 ENCOUNTER — Other Ambulatory Visit: Payer: Self-pay | Admitting: Nurse Practitioner

## 2021-03-09 LAB — CBC WITH DIFFERENTIAL/PLATELET
Absolute Monocytes: 540 cells/uL (ref 200–950)
Basophils Absolute: 42 cells/uL (ref 0–200)
Basophils Relative: 0.7 %
Eosinophils Absolute: 120 cells/uL (ref 15–500)
Eosinophils Relative: 2 %
HCT: 41.7 % (ref 35.0–45.0)
Hemoglobin: 14 g/dL (ref 11.7–15.5)
Lymphs Abs: 2148 cells/uL (ref 850–3900)
MCH: 30.5 pg (ref 27.0–33.0)
MCHC: 33.6 g/dL (ref 32.0–36.0)
MCV: 90.8 fL (ref 80.0–100.0)
MPV: 10.8 fL (ref 7.5–12.5)
Monocytes Relative: 9 %
Neutro Abs: 3150 cells/uL (ref 1500–7800)
Neutrophils Relative %: 52.5 %
Platelets: 256 10*3/uL (ref 140–400)
RBC: 4.59 10*6/uL (ref 3.80–5.10)
RDW: 12.6 % (ref 11.0–15.0)
Total Lymphocyte: 35.8 %
WBC: 6 10*3/uL (ref 3.8–10.8)

## 2021-03-09 LAB — COMPLETE METABOLIC PANEL WITH GFR
AG Ratio: 1.9 (calc) (ref 1.0–2.5)
ALT: 14 U/L (ref 6–29)
AST: 11 U/L (ref 10–30)
Albumin: 4.3 g/dL (ref 3.6–5.1)
Alkaline phosphatase (APISO): 57 U/L (ref 31–125)
BUN: 10 mg/dL (ref 7–25)
CO2: 28 mmol/L (ref 20–32)
Calcium: 9.4 mg/dL (ref 8.6–10.2)
Chloride: 106 mmol/L (ref 98–110)
Creat: 0.59 mg/dL (ref 0.50–0.99)
Globulin: 2.3 g/dL (calc) (ref 1.9–3.7)
Glucose, Bld: 120 mg/dL — ABNORMAL HIGH (ref 65–99)
Potassium: 4.4 mmol/L (ref 3.5–5.3)
Sodium: 139 mmol/L (ref 135–146)
Total Bilirubin: 0.5 mg/dL (ref 0.2–1.2)
Total Protein: 6.6 g/dL (ref 6.1–8.1)
eGFR: 114 mL/min/{1.73_m2} (ref >=60)

## 2021-03-09 LAB — TSH: TSH: 1.18 mIU/L

## 2021-03-22 ENCOUNTER — Encounter: Payer: Self-pay | Admitting: Podiatry

## 2021-03-22 ENCOUNTER — Ambulatory Visit: Payer: Self-pay

## 2021-03-22 ENCOUNTER — Ambulatory Visit: Payer: 59 | Admitting: Anesthesiology

## 2021-03-22 ENCOUNTER — Other Ambulatory Visit: Payer: Self-pay

## 2021-03-22 ENCOUNTER — Encounter
Admission: RE | Disposition: A | Discharge status: Home / Self Care | Payer: Self-pay | Source: Home / Self Care | Attending: Podiatry

## 2021-03-22 ENCOUNTER — Ambulatory Visit
Admission: RE | Admit: 2021-03-22 | Discharge: 2021-03-22 | Disposition: A | Discharge status: Home / Self Care | Payer: 59 | Attending: Podiatry | Admitting: Podiatry

## 2021-03-22 DIAGNOSIS — M7731 Calcaneal spur, right foot: Secondary | ICD-10-CM | POA: Diagnosis not present

## 2021-03-22 DIAGNOSIS — F419 Anxiety disorder, unspecified: Secondary | ICD-10-CM | POA: Diagnosis not present

## 2021-03-22 DIAGNOSIS — M722 Plantar fascial fibromatosis: Secondary | ICD-10-CM | POA: Insufficient documentation

## 2021-03-22 DIAGNOSIS — K219 Gastro-esophageal reflux disease without esophagitis: Secondary | ICD-10-CM | POA: Insufficient documentation

## 2021-03-22 HISTORY — PX: CALCANEAL OSTEOTOMY: SHX1281

## 2021-03-22 HISTORY — PX: PLANTAR FASCIA RELEASE: SHX2239

## 2021-03-22 LAB — POCT PREGNANCY, URINE: Preg Test, Ur: NEGATIVE

## 2021-03-22 SURGERY — RELEASE, FASCIA, PLANTAR
Anesthesia: General | Site: Foot | Laterality: Right

## 2021-03-22 MED ORDER — OXYCODONE HCL 5 MG/5ML PO SOLN
5.0000 mg | Freq: Once | ORAL | Status: AC | PRN
  Administered 2021-03-22: 5 mg via ORAL

## 2021-03-22 MED ORDER — OXYCODONE HCL 5 MG PO TABS: 5.0000 mg | ORAL_TABLET | Freq: Once | ORAL | Status: AC | PRN

## 2021-03-22 MED ORDER — FENTANYL CITRATE PF 50 MCG/ML IJ SOSY: 25.0000 ug | PREFILLED_SYRINGE | INTRAMUSCULAR | Status: DC | PRN

## 2021-03-22 MED ORDER — BUPIVACAINE HCL 0.5 % IJ SOLN
INTRAMUSCULAR | Status: DC | PRN
  Administered 2021-03-22: 10 mL

## 2021-03-22 MED ORDER — SODIUM CHLORIDE 0.9 % IR SOLN
Status: DC | PRN
  Administered 2021-03-22: 250 mL

## 2021-03-22 MED ORDER — ONDANSETRON HCL 4 MG PO TABS: 4.0000 mg | ORAL_TABLET | Freq: Three times a day (TID) | ORAL | 0 refills | Status: AC | PRN

## 2021-03-22 MED ORDER — HYDROCODONE-ACETAMINOPHEN 7.5-325 MG PO TABS: 1.0000 | ORAL_TABLET | Freq: Four times a day (QID) | ORAL | 0 refills | Status: AC | PRN

## 2021-03-22 MED ORDER — FENTANYL CITRATE (PF) 100 MCG/2ML IJ SOLN
INTRAMUSCULAR | Status: DC | PRN
  Administered 2021-03-22: 50 ug via INTRAVENOUS
  Administered 2021-03-22 (×2): 25 ug via INTRAVENOUS

## 2021-03-22 MED ORDER — KETOROLAC TROMETHAMINE 30 MG/ML IJ SOLN
INTRAMUSCULAR | Status: DC | PRN
  Administered 2021-03-22: 30 mg via INTRAVENOUS

## 2021-03-22 MED ORDER — PROPOFOL 10 MG/ML IV BOLUS
INTRAVENOUS | Status: DC | PRN
  Administered 2021-03-22: 170 mg via INTRAVENOUS

## 2021-03-22 MED ORDER — LIDOCAINE HCL (CARDIAC) PF 100 MG/5ML IV SOSY
PREFILLED_SYRINGE | INTRAVENOUS | Status: DC | PRN
  Administered 2021-03-22: 60 mg via INTRATRACHEAL

## 2021-03-22 MED ORDER — LACTATED RINGERS IV SOLN
INTRAVENOUS | Status: DC
Start: 2021-03-22 — End: 2021-03-22

## 2021-03-22 MED ORDER — ONDANSETRON HCL 4 MG/2ML IJ SOLN
INTRAMUSCULAR | Status: DC | PRN
  Administered 2021-03-22: 4 mg via INTRAVENOUS

## 2021-03-22 MED ORDER — 0.9 % SODIUM CHLORIDE (POUR BTL) OPTIME
TOPICAL | Status: DC | PRN
  Administered 2021-03-22: 1000 mL

## 2021-03-22 MED ORDER — ASPIRIN EC 81 MG PO TBEC: 81.0000 mg | DELAYED_RELEASE_TABLET | Freq: Two times a day (BID) | ORAL | 0 refills | Status: AC

## 2021-03-22 MED ORDER — ONDANSETRON HCL 4 MG/2ML IJ SOLN: 4.0000 mg | Freq: Once | INTRAMUSCULAR | Status: DC | PRN

## 2021-03-22 MED ORDER — MIDAZOLAM HCL 5 MG/5ML IJ SOLN
INTRAMUSCULAR | Status: DC | PRN
Start: 2021-03-22 — End: 2021-03-22
  Administered 2021-03-22: 2 mg via INTRAVENOUS

## 2021-03-22 MED ORDER — CEFAZOLIN SODIUM-DEXTROSE 2-4 GM/100ML-% IV SOLN
2.0000 g | INTRAVENOUS | Status: AC
  Administered 2021-03-22: 2 g via INTRAVENOUS

## 2021-03-22 MED ORDER — ACETAMINOPHEN 10 MG/ML IV SOLN
INTRAVENOUS | Status: DC | PRN
  Administered 2021-03-22: 1000 mg via INTRAVENOUS

## 2021-03-22 MED ORDER — DEXAMETHASONE SODIUM PHOSPHATE 4 MG/ML IJ SOLN
INTRAMUSCULAR | Status: DC | PRN
Start: 2021-03-22 — End: 2021-03-22
  Administered 2021-03-22: 4 mg via INTRAVENOUS

## 2021-03-22 MED ORDER — DEXMEDETOMIDINE (PRECEDEX) IN NS 20 MCG/5ML (4 MCG/ML) IV SYRINGE
PREFILLED_SYRINGE | INTRAVENOUS | Status: DC | PRN
  Administered 2021-03-22: 5 ug via INTRAVENOUS

## 2021-03-22 MED ORDER — AMOXICILLIN-POT CLAVULANATE 875-125 MG PO TABS: 1.0000 | ORAL_TABLET | Freq: Two times a day (BID) | ORAL | 0 refills | Status: AC

## 2021-03-22 MED ORDER — KETOROLAC TROMETHAMINE 30 MG/ML IJ SOLN: 15.0000 mg | Freq: Once | INTRAMUSCULAR | Status: DC | PRN

## 2021-03-22 MED ORDER — BUPIVACAINE LIPOSOME 1.3 % IJ SUSP
INTRAMUSCULAR | Status: DC | PRN
  Administered 2021-03-22: 10 mL

## 2021-03-22 SURGICAL SUPPLY — 35 items
BNDG CMPR STD VLCR NS LF 5.8X4 (GAUZE/BANDAGES/DRESSINGS) ×2
BNDG COHESIVE 4X5 TAN ST LF (GAUZE/BANDAGES/DRESSINGS) ×2 IMPLANT
BNDG ELASTIC 4X5.8 VLCR NS LF (GAUZE/BANDAGES/DRESSINGS) ×4 IMPLANT
BNDG ESMARK 4X12 TAN STRL LF (GAUZE/BANDAGES/DRESSINGS) ×2 IMPLANT
BNDG GAUZE ELAST 4 BULKY (GAUZE/BANDAGES/DRESSINGS) ×2 IMPLANT
BOOT STEPPER DURA MED (SOFTGOODS) ×1 IMPLANT
CANISTER SUCT 1200ML W/VALVE (MISCELLANEOUS) ×2 IMPLANT
COVER LIGHT HANDLE UNIVERSAL (MISCELLANEOUS) ×4 IMPLANT
CUFF TOURN SGL QUICK 18X4 (TOURNIQUET CUFF) IMPLANT
DURAPREP 26ML APPLICATOR (WOUND CARE) ×2 IMPLANT
ELECT REM PT RETURN 9FT ADLT (ELECTROSURGICAL) ×2
ELECTRODE REM PT RTRN 9FT ADLT (ELECTROSURGICAL) ×1 IMPLANT
GAUZE SPONGE 4X4 12PLY STRL (GAUZE/BANDAGES/DRESSINGS) ×2 IMPLANT
GAUZE XEROFORM 5X9 LF (GAUZE/BANDAGES/DRESSINGS) ×1 IMPLANT
GLOVE SURG ENC MOIS LTX SZ7 (GLOVE) ×2 IMPLANT
GLOVE SURG POLYISO LF SZ7 (GLOVE) ×4 IMPLANT
GLOVE SURG UNDER POLY LF SZ7 (GLOVE) ×2 IMPLANT
GOWN STRL REUS W/ TWL LRG LVL3 (GOWN DISPOSABLE) ×2 IMPLANT
GOWN STRL REUS W/TWL LRG LVL3 (GOWN DISPOSABLE) ×4
IV NS 250ML (IV SOLUTION) ×2
IV NS 250ML BAXH (IV SOLUTION) ×1 IMPLANT
IV NS 500ML (IV SOLUTION) ×2
IV NS 500ML BAXH (IV SOLUTION) IMPLANT
KIT CARPAL TUNNEL (MISCELLANEOUS) ×4
KIT ESCP INSRT D SLOT CANN KN (MISCELLANEOUS) ×1 IMPLANT
KIT PRC PRB RTRGD 3ANG KNF HND (MISCELLANEOUS) ×1 IMPLANT
KIT TURNOVER KIT A (KITS) ×2 IMPLANT
NS IRRIG 500ML POUR BTL (IV SOLUTION) ×2 IMPLANT
PACK EXTREMITY ARMC (MISCELLANEOUS) ×2 IMPLANT
PADDING CAST BLEND 4X4 NS (MISCELLANEOUS) ×4 IMPLANT
RASP SM TEAR CROSS CUT (RASP) ×1 IMPLANT
STOCKINETTE IMPERVIOUS LG (DRAPES) ×2 IMPLANT
SUT ETHILON 3-0 FS-10 30 BLK (SUTURE) ×2
SUTURE EHLN 3-0 FS-10 30 BLK (SUTURE) IMPLANT
WAND TOPAZ MICRO DEBRIDER (MISCELLANEOUS) ×1 IMPLANT

## 2021-03-22 NOTE — Discharge Instructions (Addendum)
[facility]ENTER  POST OPERATIVE INSTRUCTIONS FOR DR. Ether Griffins AND DR. BAKER [facility] CLINIC PODIATRY DEPARTMENT   Take your medication as prescribed.  Pain medication should be taken only as needed.  You may also take ibuprofen or Tylenol as needed.  If pain is uncontrolled he may try taking ibuprofen or Tylenol between pain medication doses.  Pain is still uncontrolled and very severe that he may try taking 1 pain tablet every 4 hours instead of every 6.  Keep the dressing clean, dry and intact.  Remain nonweightbearing at all times to the right lower extremity.  Keep cam boot on at all times as well as we will be splinting her foot.  Keep your foot elevated above the heart level for the first 48 hours.  Continue elevation thereafter to improve swelling.  You may also place ice to the outside portion of your boot at the front of the ankle for maximum 10 minutes out of every 1 hour as needed.  Walking to the bathroom and brief periods of walking are acceptable, unless we have instructed you to be non-weight bearing.  Use knee scooter, crutches, or a wheelchair to get around at this time as you are required to be nonweightbearing for at least 2 weeks after the surgery.  Do not take a shower. Baths are permissible as long as the foot/boot/dressing is kept out of the water.   Every hour you are awake:  Bend your knee 15 times. Massage calf 15 times  Call [facility] [phone]) if any of the following problems occur: You develop a temperature or fever. The bandage becomes saturated with blood. Medication does not stop your pain. Injury of the foot occurs. Any symptoms of infection including redness, odor, or red streaks running from wound.  Information for Discharge Teaching: EXPAREL (bupivacaine liposome injectable suspension)   Your surgeon or anesthesiologist gave you EXPAREL(bupivacaine) to help control your pain after surgery.  EXPAREL  is a local anesthetic that provides pain relief by numbing the tissue around the surgical site. EXPAREL is designed to release pain medication over time and can control pain for up to 72 hours. Depending on how you respond to EXPAREL, you may require less pain medication during your recovery.  Possible side effects: Temporary loss of sensation or ability to move in the area where bupivacaine was injected. Nausea, vomiting, constipation Rarely, numbness and tingling in your mouth or lips, lightheadedness, or anxiety may occur. Call your doctor right away if you think you may be experiencing any of these sensations, or if you have other questions regarding possible side effects.  Follow all other discharge instructions given to you by your surgeon or nurse. Eat a healthy diet and drink plenty of water or other fluids.  If you return to the hospital for any reason within 96 hours following the administration of EXPAREL, it is important for health care providers to know that you have received this anesthetic. A teal colored band has been placed on your arm with the date, time and amount of EXPAREL you have received in order to alert and inform your health care providers. Please leave this armband in place for the full 96 hours following administration, and then you may remove the band.

## 2021-03-22 NOTE — Transfer of Care (Signed)
Immediate Anesthesia Transfer of Care Note  Patient: Catherine Adams  Procedure(s) Performed: ENDOSCOPIC PLANTAR FASCIA RELEASE (Right: Foot) PARTIAL CALCANECTOMY (Right: Foot)  Patient Location: PACU  Anesthesia Type: General  Level of Consciousness: awake, alert  and patient cooperative  Airway and Oxygen Therapy: Patient Spontanous Breathing and Patient connected to supplemental oxygen  Post-op Assessment: Post-op Vital signs reviewed, Patient's Cardiovascular Status Stable, Respiratory Function Stable, Patent Airway and No signs of Nausea or vomiting  Post-op Vital Signs: Reviewed and stable  Complications: No notable events documented.

## 2021-03-22 NOTE — Anesthesia Postprocedure Evaluation (Signed)
Anesthesia Post Note  Patient: Catherine Adams  Procedure(s) Performed: ENDOSCOPIC PLANTAR FASCIA RELEASE (Right: Foot) PARTIAL CALCANECTOMY (Right: Foot)     Patient location during evaluation: PACU Anesthesia Type: General Level of consciousness: awake and alert Pain management: pain level controlled Vital Signs Assessment: post-procedure vital signs reviewed and stable Respiratory status: spontaneous breathing, nonlabored ventilation, respiratory function stable and patient connected to nasal cannula oxygen Cardiovascular status: blood pressure returned to baseline and stable Postop Assessment: no apparent nausea or vomiting Anesthetic complications: no   No notable events documented.  Sinda Du

## 2021-03-22 NOTE — H&P (Signed)
HISTORY AND PHYSICAL INTERVAL NOTE:  03/22/2021  7:24 AM  Catherine Adams  has presented today for surgery, with the diagnosis of M72.2 - Plantar fasciitis of right foot M77.31 - Clacaneal spur, right.  The various methods of treatment have been discussed with the patient.  No guarantees were given.  After consideration of risks, benefits and other options for treatment, the patient has consented to surgery.  I have reviewed the patients chart and labs.    PROCEDURE: RIGHT FOOT ENDOSCOPIC PLANTAR FASCIOTOMY RIGHT HEEL SPUR RESECTION TOPAZ MICRODEBRIDEMENT OF RIGHT PLANTAR FASCIA  A history and physical examination was performed in my office.  The patient was reexamined.  There have been no changes to this history and physical examination.  Rosetta Posner, DPM

## 2021-03-22 NOTE — Op Note (Signed)
PODIATRY / FOOT AND ANKLE SURGERY OPERATIVE REPORT    SURGEON: Caroline More, DPM  PRE-OPERATIVE DIAGNOSIS:  1.  Right plantar fasciitis 2.  Right plantar calcaneal heel spur  POST-OPERATIVE DIAGNOSIS: Same  PROCEDURE(S): Right endoscopic plantar fasciotomy Right Topaz microdebridement of plantar fascia Right resection of plantar calcaneal heel spur  HEMOSTASIS: Ankle tourniquet  ANESTHESIA: MAC  ESTIMATED BLOOD LOSS: 10 cc  FINDING(S): 1.  Increased thickness noted to the plantar fascia 2.  Large plantar heel spur  PATHOLOGY/SPECIMEN(S): None  INDICATIONS:   Catherine Adams is a 46 y.o. female who presents with chronic plantar heel pain to the right foot.  Patient's been treated conservatively with multiple measures including changes in shoe gear, orthoses, steroid injections, physical therapy, night splint/rest plans, RICE therapy with use of NSAIDs or Tylenol as needed but still continues to have residual pain.  Patient had an MRI prior to today which indicated increased thickness to the plantar fascia as well as a potential small partial tear in the plantar fascia.  Patient also had some marrow edema within the calcaneus at the origin of the plantar fascia with large plantar heel spur.  Discussed all treatment options with the patient both conservative and surgical attempts at correction including potential risks and complications at this time patient is elected for surgical intervention described above.  No guarantees given.  Consent obtained prior to procedure.  DESCRIPTION: After obtaining full informed written consent, the patient was brought back to the operating room and placed supine upon the operating table.  The patient received IV antibiotics prior to induction.  After obtaining adequate anesthesia, a 10 cc half percent Marcaine block was performed around the right heel near the incisional areas and plantar heel.  The patient was prepped and draped in the standard  fashion.  An Esmarch bandage was used to exsanguinate the right lower extremity and pneumatic ankle tourniquet was inflated.  Attention was directed to the medial aspect of the right heel slightly distal to the plantar heel spur area and parallel to the longitudinal axis of the plantar fascia where a small 1 cm incision was made.  The incision was deepened to the subcutaneous tissues with blunt dissection creating an interval between the plantar fascia and the fat layer on the bottom of the foot.  This was dissected from medial to lateral and the lateral skin was able to be seen.  A small skin incision was made there was approximately 1 cm to the lateral heel.  At this time the obturator and cannula was then placed in the obturator was removed leaving the cannula in place.  Cotton tip applicators were then placed to the cannula to clean out the cannula site.  The scope was then placed through the lateral portal and the hook blade was then introduced into the medial portal and two thirds of the central band of the plantar fascia was released and all of the medial band was released.  Successful release was seen as the flexor digitorum brevis muscle belly was able to be visualized along the entire course of where the hook plate was placed.  The surgical site was flushed with copious amounts normal sterile saline and the instrumentation was removed.  Attention was directed to the medial heel incision where a paddle rasp was placed onto the medial calcaneal tubercle in the area of the heel spur.  Under fluoroscopic guidance the heel spur was resected out of the operative site.  Surgical site was once again flushed with  copious amounts normal sterile saline.  C-arm imaging was taken verifying reduction in heel spur formation which appeared to be adequate and the heel appeared to be having a normal anatomic contour.  Both skin incisions were then reapproximated well coapted with 3-0 nylon in a combination of simple and  horizontal mattress type stitching.  Attention was then directed to the plantar heel where a 4 x 5 grid was made at the central and medial aspect of the plantar heel.  A 0.062 K wire was used to perforate small holes within the skin to the area.  At this time the Topaz microdebrider instrument was inserted into each hole and directed in 3 different directions stimulating the plantar fascia.  The instrumentation was then removed.  10 cc of Exparel was then injected about the incision sites as well as the plantar heel.    The right ankle tourniquet was deflated and a prompt hyperresponsiveness noted all digits of the right foot.  Postoperative dressing was then applied consisting of Xeroform to the incision lines and plantar heel followed by 4 x 4 gauze, Kerlix, Webril, Ace wrap.  Patient tolerated the procedure and anesthesia well and was transferred to recovery with vital signs stable vascular status intact all toes of the right foot.  Patient was placed in a tall cam boot.  Patient to remain nonweightbearing for the next 2 weeks to the right foot.  Postoperative orders and instructions given to patient.  Patient to follow-up within 1 week of surgical date in clinic for further evaluation.  COMPLICATIONS: None  CONDITION: Good, stable  Caroline More, DPM

## 2021-03-22 NOTE — Anesthesia Procedure Notes (Signed)
Procedure Name: LMA Insertion Date/Time: 03/22/2021 7:38 AM Performed by: Lily Kocher, CRNA Pre-anesthesia Checklist: Patient identified, Patient being monitored, Timeout performed, Emergency Drugs available and Suction available Patient Re-evaluated:Patient Re-evaluated prior to induction Oxygen Delivery Method: Circle system utilized Preoxygenation: Pre-oxygenation with 100% oxygen Induction Type: IV induction Ventilation: Mask ventilation without difficulty LMA: LMA inserted LMA Size: 4.0 Tube type: Oral Number of attempts: 1 Placement Confirmation: positive ETCO2 and breath sounds checked- equal and bilateral Tube secured with: Tape Dental Injury: Teeth and Oropharynx as per pre-operative assessment

## 2021-03-22 NOTE — Anesthesia Preprocedure Evaluation (Signed)
Anesthesia Evaluation  Patient identified by MRN, date of birth, ID band Patient awake    Reviewed: Allergy & Precautions, NPO status , Patient's Chart, lab work & pertinent test results  Airway Mallampati: II  TM Distance: >3 FB Neck ROM: Full    Dental no notable dental hx.    Pulmonary neg pulmonary ROS, former smoker,    Pulmonary exam normal breath sounds clear to auscultation       Cardiovascular Exercise Tolerance: Good Normal cardiovascular exam Rhythm:Regular Rate:Normal  HLD   Neuro/Psych PSYCHIATRIC DISORDERS Anxiety negative neurological ROS     GI/Hepatic Neg liver ROS, GERD  ,  Endo/Other  negative endocrine ROS  Renal/GU negative Renal ROS  negative genitourinary   Musculoskeletal negative musculoskeletal ROS (+)   Abdominal (+) + obese,  Abdomen: soft.    Peds negative pediatric ROS (+)  Hematology negative hematology ROS (+)   Anesthesia Other Findings   Reproductive/Obstetrics negative OB ROS                             Anesthesia Physical Anesthesia Plan  ASA: 2  Anesthesia Plan: General   Post-op Pain Management:    Induction: Intravenous  PONV Risk Score and Plan: 3 and Treatment may vary due to age or medical condition, Midazolam and Ondansetron  Airway Management Planned: LMA  Additional Equipment:   Intra-op Plan:   Post-operative Plan: Extubation in OR  Informed Consent:     Dental advisory given  Plan Discussed with: CRNA and Anesthesiologist  Anesthesia Plan Comments:         Anesthesia Quick Evaluation  Patient Active Problem List   Diagnosis Date Noted   Controlled substance agreement signed 09/16/2020   Acute midline low back pain without sciatica 09/16/2020   Hx of seasonal allergies 09/16/2020   Chronic diarrhea 07/09/2019   Class 1 obesity 07/09/2019   Hyperlipidemia 07/07/2018   GERD (gastroesophageal reflux  disease) 05/27/2017   Thyroid nodule 05/27/2017   Abdominal pain, unspecified site 07/29/2012   GAD (generalized anxiety disorder)     CBC Latest Ref Rng & Units 03/08/2021 09/11/2020 01/21/2020  WBC 3.8 - 10.8 Thousand/uL 6.0 6.8 7.7  Hemoglobin 11.7 - 15.5 g/dL 14.0 13.2 13.8  Hematocrit 35.0 - 45.0 % 41.7 38.7 40.0  Platelets 140 - 400 Thousand/uL 256 238 237   BMP Latest Ref Rng & Units 03/08/2021 09/20/2020 09/11/2020  Glucose 65 - 99 mg/dL 120(H) - 106(H)  BUN 7 - 25 mg/dL 10 - 14  Creatinine 0.50 - 0.99 mg/dL 0.59 - 0.61  BUN/Creat Ratio 6 - 22 (calc) NOT APPLICABLE - NOT APPLICABLE  Sodium [id number] - 146 mmol/L 139 - 139  Potassium 3.5 - 5.3 mmol/L 4.4 - 4.5  Chloride 98 - 110 mmol/L 106 - 110  CO2 20 - 32 mmol/L 28 - 23  Calcium 8.6 - 10.2 mg/dL 9.4 9.8 8.3(L)    Risks and benefits of anesthesia discussed at length, patient or surrogate demonstrates understanding. Appropriately NPO. Plan to proceed with anesthesia.  Champ Mungo, MD 03/22/21

## 2021-03-23 ENCOUNTER — Encounter: Payer: Self-pay | Admitting: Podiatry

## 2021-04-30 ENCOUNTER — Other Ambulatory Visit: Payer: Self-pay | Admitting: Nurse Practitioner

## 2021-04-30 DIAGNOSIS — F411 Generalized anxiety disorder: Secondary | ICD-10-CM

## 2021-05-01 NOTE — Telephone Encounter (Signed)
Per chart pt has new PCP. ? ?Please review, thanks! ? ?

## 2021-07-19 ENCOUNTER — Other Ambulatory Visit: Payer: Self-pay | Admitting: Nurse Practitioner

## 2021-07-19 DIAGNOSIS — K219 Gastro-esophageal reflux disease without esophagitis: Secondary | ICD-10-CM

## 2021-07-19 NOTE — Telephone Encounter (Signed)
Requested medication (s) are due for refill today: yes  Requested medication (s) are on the active medication list: yes  Last refill:  09/14/21 #90 with 1 RF  Future visit scheduled: no, last seen 03/08/21  Notes to clinic:  Previous provider originally ordered, please assess.      Requested Prescriptions  Pending Prescriptions Disp Refills   pantoprazole (PROTONIX) 40 MG tablet [Pharmacy Med Name: PANTOPRAZOLE SOD DR 40 MG TAB] 90 tablet 1    Sig: TAKE 1 TABLET BY MOUTH EVERY DAY     Gastroenterology: Proton Pump Inhibitors Passed - 07/19/2021  8:41 AM      Passed - Valid encounter within last 12 months    Recent Outpatient Visits           4 months ago GAD (generalized anxiety disorder)   [place] Eulogio Bear, NP   7 months ago GAD (generalized anxiety disorder)   [place] Eulogio Bear, NP   10 months ago Annual physical exam   [facility] Eulogio Bear, NP   1 year ago Sore throat   [place] Eulogio Bear, NP   1 year ago GAD (generalized anxiety disorder)   [facility] [place], [place], MD

## 2021-11-27 ENCOUNTER — Other Ambulatory Visit: Payer: Self-pay | Admitting: Obstetrics and Gynecology

## 2021-11-27 DIAGNOSIS — R928 Other abnormal and inconclusive findings on diagnostic imaging of breast: Secondary | ICD-10-CM

## 2021-11-27 DIAGNOSIS — N6489 Other specified disorders of breast: Secondary | ICD-10-CM

## 2021-12-07 ENCOUNTER — Ambulatory Visit
Admission: RE | Admit: 2021-12-07 | Discharge: 2021-12-07 | Disposition: A | Discharge status: Home / Self Care | Payer: 59 | Source: Ambulatory Visit | Attending: Obstetrics and Gynecology | Admitting: Obstetrics and Gynecology

## 2021-12-07 DIAGNOSIS — R928 Other abnormal and inconclusive findings on diagnostic imaging of breast: Secondary | ICD-10-CM

## 2021-12-07 DIAGNOSIS — N6489 Other specified disorders of breast: Secondary | ICD-10-CM

## 2022-02-16 ENCOUNTER — Ambulatory Visit
Admission: EM | Admit: 2022-02-16 | Discharge: 2022-02-16 | Disposition: A | Discharge status: Home / Self Care | Payer: 59

## 2022-02-16 DIAGNOSIS — U071 COVID-19: Secondary | ICD-10-CM

## 2022-02-16 LAB — POCT RAPID STREP A (OFFICE): Rapid Strep A Screen: NEGATIVE

## 2022-02-16 MED ORDER — NIRMATRELVIR/RITONAVIR (PAXLOVID)TABLET: 3.0000 | ORAL_TABLET | Freq: Two times a day (BID) | ORAL | 0 refills | Status: AC

## 2022-02-16 MED ORDER — PROMETHAZINE-DM 6.25-15 MG/5ML PO SYRP: 5.0000 mL | ORAL_SOLUTION | Freq: Four times a day (QID) | ORAL | 0 refills | Status: DC | PRN

## 2022-02-16 NOTE — ED Provider Notes (Signed)
[facility]    CSN: [id number] Arrival date & time: 02/16/22  1024      History   Chief Complaint Chief Complaint  Patient presents with   Cough    Sore throat - Entered by patient    HPI Catherine Adams is a 46 y.o. female.   Presenting today with 2-day history of fever, chills, sinus pressure, body aches, sore throat, dry cough, fatigue.  Took a home COVID test that was positive.  Trying NyQuil and Dimetapp with minimal relief.  Denies chest pain, shortness of breath, abdominal pain, nausea vomiting or diarrhea.     Past Medical History:  Diagnosis Date   Anxiety     Patient Active Problem List   Diagnosis Date Noted   Controlled substance agreement signed 09/16/2020   Acute midline low back pain without sciatica 09/16/2020   Hx of seasonal allergies 09/16/2020   Chronic diarrhea 07/09/2019   Class 1 obesity 07/09/2019   Hyperlipidemia 07/07/2018   GERD (gastroesophageal reflux disease) 05/27/2017   Thyroid nodule 05/27/2017   Abdominal pain, unspecified site 07/29/2012   GAD (generalized anxiety disorder)     Past Surgical History:  Procedure Laterality Date   CALCANEAL OSTEOTOMY Right 03/22/2021   Procedure: PARTIAL CALCANECTOMY;  Surgeon: Rosetta Posner, DPM;  Location: [facility];  Service: Podiatry;  Laterality: Right;   CESAREAN SECTION     PLANTAR FASCIA RELEASE Right 03/22/2021   Procedure: ENDOSCOPIC PLANTAR FASCIA RELEASE;  Surgeon: Rosetta Posner, DPM;  Location: [facility];  Service: Podiatry;  Laterality: Right;   TUBAL LIGATION      OB History   No obstetric history on file.      Home Medications    Prior to Admission medications   Medication Sig Start Date End Date Taking? Authorizing Provider  nirmatrelvir/ritonavir (PAXLOVID) 20 x 150 MG & 10 x 100MG  TABS Take 3 tablets by mouth 2 (two) times daily for 5 days. Patient GFR is >60.Take nirmatrelvir (150 mg) two tablets twice daily for 5 days and ritonavir (100  mg) one tablet twice daily for 5 days. 02/16/22 02/21/22 Yes 04/22/22, PA-C  promethazine-dextromethorphan (PROMETHAZINE-DM) 6.25-15 MG/5ML syrup Take 5 mLs by mouth 4 (four) times daily as needed. 02/16/22  Yes 02/18/22, PA-C  WEGOVY 2.4 MG/0.75ML SOAJ INJECT 2.4MG  INTO THE SKIN ONCE WEEKLY. 12/06/21  Yes [provider]  ALPRAZolam (XANAX) 0.25 MG tablet TAKE 1 TABLET (0.25 MG TOTAL) BY MOUTH AT BEDTIME AS NEEDED. 05/03/21   05/05/21, MD  augmented betamethasone dipropionate (DIPROLENE-AF) 0.05 % cream Apply topically 2 (two) times daily. 11/22/20   [provider]  cyclobenzaprine (FLEXERIL) 10 MG tablet Take 1 tablet (10 mg total) by mouth at bedtime as needed for muscle spasms. 09/14/20   09/16/20, NP  fluorouracil (EFUDEX) 5 % cream APPLY 1 AS DIRECTED TO SKIN TWICE A DAY APPLY TO AFFECTED AREAS TWICE A DAY FOR 14 DAYS 03/02/21   [provider]  fluticasone (FLONASE) 50 MCG/ACT nasal spray Place 2 sprays into both nostrils daily. 09/14/20   09/16/20, NP  ondansetron (ZOFRAN) 4 MG tablet Take 1 tablet (4 mg total) by mouth every 8 (eight) hours as needed for nausea or vomiting. 03/22/21   05/20/21, DPM  pantoprazole (PROTONIX) 40 MG tablet Take 1 tablet (40 mg total) by mouth daily. 09/14/20   09/16/20, NP  tretinoin (RETIN-A) 0.1 % cream APPLY SPARINGLY TO AFFECTED AREA TWICE A  DAY    [provider]    Family History Family History  Problem Relation Age of Onset   Hypertension Mother    Heart disease Father 66       CAD   Hypertension Father    ADD / ADHD Brother    Drug abuse Brother    Cancer Maternal Grandmother        breast cancer     Social History Social History   Tobacco Use   Smoking status: Former    Types: Cigarettes    Quit date: 2012    Years since quitting: 12.0   Smokeless tobacco: Never  Vaping Use   Vaping Use: Never used  Substance Use Topics   Alcohol  use: No   Drug use: No     Allergies   Codeine, Lamisil [terbinafine], Lexapro [escitalopram], and Monistat [miconazole nitrate-wipes]   Review of Systems Review of Systems Per HPI  Physical Exam Triage Vital Signs ED Triage Vitals  Enc Vitals Group     BP 02/16/22 1307 123/79     Pulse Rate 02/16/22 1307 96     Resp 02/16/22 1307 18     Temp 02/16/22 1307 99.1 F (37.3 C)     Temp Source 02/16/22 1307 Oral     SpO2 02/16/22 1307 98 %     Weight --      Height --      Head Circumference --      Peak Flow --      Pain Score 02/16/22 1312 0     Pain Loc --      Pain Edu? --      Excl. in GC? --    No data found.  Updated Vital Signs BP 123/79 (BP Location: Right Arm)   Pulse 96   Temp 99.1 F (37.3 C) (Oral)   Resp 18   LMP 02/07/2022 (Exact Date)   SpO2 98%   Visual Acuity Right Eye Distance:   Left Eye Distance:   Bilateral Distance:    Right Eye Near:   Left Eye Near:    Bilateral Near:     Physical Exam Vitals and nursing note reviewed.  Constitutional:      Appearance: Normal appearance. She is not ill-appearing.  HENT:     Head: Atraumatic.     Right Ear: Tympanic membrane and external ear normal.     Left Ear: Tympanic membrane and external ear normal.     Nose: Rhinorrhea present.     Mouth/Throat:     Mouth: Mucous membranes are moist.     Pharynx: Posterior oropharyngeal erythema present.  Eyes:     Extraocular Movements: Extraocular movements intact.     Conjunctiva/sclera: Conjunctivae normal.  Cardiovascular:     Rate and Rhythm: Normal rate and regular rhythm.     Heart sounds: Normal heart sounds.  Pulmonary:     Effort: Pulmonary effort is normal.     Breath sounds: Normal breath sounds. No wheezing or rales.  Musculoskeletal:        General: Normal range of motion.     Cervical back: Normal range of motion and neck supple.  Skin:    General: Skin is warm and dry.  Neurological:     Mental Status: She is alert and oriented  to person, place, and time.  Psychiatric:        Mood and Affect: Mood normal.        Thought Content: Thought content normal.  Judgment: Judgment normal.     UC Treatments / Results  Labs (all labs ordered are listed, but only abnormal results are displayed) Labs Reviewed  POCT RAPID STREP A (OFFICE)    EKG   Radiology No results found.  Procedures Procedures (including critical care time)  Medications Ordered in UC Medications - No data to display  Initial Impression / Assessment and Plan / UC Course  I have reviewed the triage vital signs and the nursing notes.  Pertinent labs & imaging results that were available during my care of the patient were reviewed by me and considered in my medical decision making (see chart for details).     Rapid strep per patient request was negative, home COVID test was positive.  Treat with Phenergan DM, Paxlovid, supportive over-the-counter medications and home care.  Return for worsening symptoms.  Final Clinical Impressions(s) / UC Diagnoses   Final diagnoses:  COVID-19   Discharge Instructions   None    ED Prescriptions     Medication Sig Dispense Auth. Provider   promethazine-dextromethorphan (PROMETHAZINE-DM) 6.25-15 MG/5ML syrup Take 5 mLs by mouth 4 (four) times daily as needed. 100 mL Particia Nearing, [place]   nirmatrelvir/ritonavir (PAXLOVID) 20 x 150 MG & 10 x 100MG  TABS Take 3 tablets by mouth 2 (two) times daily for 5 days. Patient GFR is >60.Take nirmatrelvir (150 mg) two tablets twice daily for 5 days and ritonavir (100 mg) one tablet twice daily for 5 days. 30 tablet , Particia Nearing      PDMP not reviewed this encounter.   [place], Particia Nearing 02/16/22 1400

## 2022-02-16 NOTE — ED Triage Notes (Signed)
Pt reports she took a covid test and it was positive. Sinus pressure,body aches, sore throat, dry cough, chills x 2 days.  Took nyquil and  dymatap but no relief.

## 2022-09-05 ENCOUNTER — Other Ambulatory Visit: Payer: Self-pay | Admitting: Obstetrics and Gynecology

## 2022-09-05 DIAGNOSIS — Z1231 Encounter for screening mammogram for malignant neoplasm of breast: Secondary | ICD-10-CM

## 2022-11-27 ENCOUNTER — Other Ambulatory Visit: Payer: Self-pay | Admitting: Podiatry

## 2022-11-27 DIAGNOSIS — M2141 Flat foot [pes planus] (acquired), right foot: Secondary | ICD-10-CM

## 2022-11-27 DIAGNOSIS — M722 Plantar fascial fibromatosis: Secondary | ICD-10-CM

## 2022-11-27 DIAGNOSIS — E66811 Obesity, class 1: Secondary | ICD-10-CM

## 2022-11-27 DIAGNOSIS — M79672 Pain in left foot: Secondary | ICD-10-CM

## 2022-11-27 DIAGNOSIS — M216X1 Other acquired deformities of right foot: Secondary | ICD-10-CM

## 2022-11-27 DIAGNOSIS — M7732 Calcaneal spur, left foot: Secondary | ICD-10-CM

## 2022-11-27 DIAGNOSIS — L909 Atrophic disorder of skin, unspecified: Secondary | ICD-10-CM

## 2022-12-09 ENCOUNTER — Ambulatory Visit: Payer: 59

## 2022-12-09 ENCOUNTER — Ambulatory Visit
Admission: RE | Admit: 2022-12-09 | Discharge: 2022-12-09 | Disposition: A | Discharge status: Home / Self Care | Payer: 59 | Source: Ambulatory Visit | Attending: Obstetrics and Gynecology | Admitting: Obstetrics and Gynecology

## 2022-12-09 DIAGNOSIS — Z1231 Encounter for screening mammogram for malignant neoplasm of breast: Secondary | ICD-10-CM

## 2022-12-12 ENCOUNTER — Other Ambulatory Visit: Payer: Self-pay | Admitting: Obstetrics and Gynecology

## 2022-12-12 DIAGNOSIS — R928 Other abnormal and inconclusive findings on diagnostic imaging of breast: Secondary | ICD-10-CM

## 2022-12-30 ENCOUNTER — Other Ambulatory Visit: Payer: 59

## 2023-01-03 ENCOUNTER — Ambulatory Visit
Admission: RE | Admit: 2023-01-03 | Discharge: 2023-01-03 | Disposition: A | Discharge status: Home / Self Care | Payer: 59 | Source: Ambulatory Visit | Attending: Obstetrics and Gynecology | Admitting: Obstetrics and Gynecology

## 2023-01-03 ENCOUNTER — Ambulatory Visit: Payer: 59

## 2023-01-03 DIAGNOSIS — R928 Other abnormal and inconclusive findings on diagnostic imaging of breast: Secondary | ICD-10-CM

## 2023-01-06 ENCOUNTER — Ambulatory Visit
Admission: RE | Admit: 2023-01-06 | Discharge: 2023-01-06 | Disposition: A | Discharge status: Home / Self Care | Payer: 59 | Source: Ambulatory Visit | Attending: Podiatry

## 2023-01-06 DIAGNOSIS — L909 Atrophic disorder of skin, unspecified: Secondary | ICD-10-CM

## 2023-01-06 DIAGNOSIS — M2141 Flat foot [pes planus] (acquired), right foot: Secondary | ICD-10-CM

## 2023-01-06 DIAGNOSIS — M216X1 Other acquired deformities of right foot: Secondary | ICD-10-CM

## 2023-01-06 DIAGNOSIS — M7732 Calcaneal spur, left foot: Secondary | ICD-10-CM

## 2023-01-06 DIAGNOSIS — M722 Plantar fascial fibromatosis: Secondary | ICD-10-CM

## 2023-01-06 DIAGNOSIS — E6609 Other obesity due to excess calories: Secondary | ICD-10-CM

## 2023-01-06 DIAGNOSIS — M79672 Pain in left foot: Secondary | ICD-10-CM

## 2023-01-07 ENCOUNTER — Other Ambulatory Visit: Payer: Self-pay | Admitting: Obstetrics and Gynecology

## 2023-01-07 DIAGNOSIS — Z0000 Encounter for general adult medical examination without abnormal findings: Secondary | ICD-10-CM

## 2023-02-26 ENCOUNTER — Other Ambulatory Visit: Payer: Self-pay | Admitting: Podiatry

## 2023-03-04 ENCOUNTER — Encounter: Payer: Self-pay | Admitting: Podiatry

## 2023-03-10 NOTE — Discharge Instructions (Signed)
[facility]  POST OPERATIVE INSTRUCTIONS FOR DR. Ether Griffins AND DR. Amberlie Gaillard [facility] PODIAT[facility]PART[facility]   Take your medication as prescribed.  Pain medication should be taken only as needed.  Keep the dressing clean, dry and intact.  Keep your foot elevated above the heart level for the first 48 hours.  Walking to the bathroom and brief periods of walking are acceptable, unless we have instructed you to be non-weight bearing.  Always wear your post-op shoe when walking.  Always use your crutches if you are to be non-weight bearing.  Do not take a shower. Baths are permissible as long as the foot is kept out of the water.   Every hour you are awake:  Bend your knee 15 times. Flex foot 15 times Massage calf 15 times  Call [facility] [phone]) if any of the following problems occur: You develop a temperature or fever. The bandage becomes saturated with blood. Medication does not stop your pain. Injury of the foot occurs. Any symptoms of infection including redness, odor, or red streaks running from wound.

## 2023-03-13 ENCOUNTER — Other Ambulatory Visit: Payer: Self-pay

## 2023-03-13 ENCOUNTER — Ambulatory Visit: Payer: Self-pay

## 2023-03-13 ENCOUNTER — Ambulatory Visit: Payer: 59 | Admitting: Anesthesiology

## 2023-03-13 ENCOUNTER — Ambulatory Visit
Admission: RE | Admit: 2023-03-13 | Discharge: 2023-03-13 | Disposition: A | Discharge status: Home / Self Care | Payer: 59 | Attending: Podiatry | Admitting: Podiatry

## 2023-03-13 ENCOUNTER — Encounter
Admission: RE | Disposition: A | Discharge status: Home / Self Care | Payer: Self-pay | Source: Home / Self Care | Attending: Podiatry

## 2023-03-13 ENCOUNTER — Encounter: Payer: Self-pay | Admitting: Podiatry

## 2023-03-13 DIAGNOSIS — M722 Plantar fascial fibromatosis: Secondary | ICD-10-CM | POA: Diagnosis present

## 2023-03-13 DIAGNOSIS — F419 Anxiety disorder, unspecified: Secondary | ICD-10-CM | POA: Diagnosis not present

## 2023-03-13 DIAGNOSIS — M216X1 Other acquired deformities of right foot: Secondary | ICD-10-CM | POA: Diagnosis not present

## 2023-03-13 DIAGNOSIS — Z87891 Personal history of nicotine dependence: Secondary | ICD-10-CM | POA: Insufficient documentation

## 2023-03-13 DIAGNOSIS — Z79899 Other long term (current) drug therapy: Secondary | ICD-10-CM | POA: Diagnosis not present

## 2023-03-13 DIAGNOSIS — M216X2 Other acquired deformities of left foot: Secondary | ICD-10-CM | POA: Insufficient documentation

## 2023-03-13 DIAGNOSIS — M7732 Calcaneal spur, left foot: Secondary | ICD-10-CM | POA: Diagnosis not present

## 2023-03-13 DIAGNOSIS — K219 Gastro-esophageal reflux disease without esophagitis: Secondary | ICD-10-CM | POA: Diagnosis not present

## 2023-03-13 HISTORY — PX: EXCISION PARTIAL PHALANX: SHX6617

## 2023-03-13 HISTORY — DX: Nausea with vomiting, unspecified: Z98.890

## 2023-03-13 HISTORY — DX: Family history of other specified conditions: Z84.89

## 2023-03-13 HISTORY — PX: PLANTAR FASCIA RELEASE: SHX2239

## 2023-03-13 HISTORY — DX: Nausea with vomiting, unspecified: R11.2

## 2023-03-13 LAB — POCT PREGNANCY, URINE: Preg Test, Ur: NEGATIVE

## 2023-03-13 SURGERY — RELEASE, FASCIA, PLANTAR
Anesthesia: General | Laterality: Left

## 2023-03-13 MED ORDER — HYDROCODONE-ACETAMINOPHEN 5-325 MG PO TABS: 1.0000 | ORAL_TABLET | Freq: Four times a day (QID) | ORAL | 0 refills | Status: AC | PRN

## 2023-03-13 MED ORDER — KETOROLAC TROMETHAMINE 30 MG/ML IJ SOLN
INTRAMUSCULAR | Status: AC
  Filled 2023-03-13: qty 1

## 2023-03-13 MED ORDER — BUPIVACAINE LIPOSOME 1.3 % IJ SUSP
INTRAMUSCULAR | Status: DC | PRN
  Administered 2023-03-13: 10 mL

## 2023-03-13 MED ORDER — MIDAZOLAM HCL 2 MG/2ML IJ SOLN
INTRAMUSCULAR | Status: DC | PRN
  Administered 2023-03-13: 2 mg via INTRAVENOUS

## 2023-03-13 MED ORDER — PROPOFOL 10 MG/ML IV BOLUS
INTRAVENOUS | Status: AC
Start: 2023-03-13 — End: ?
  Filled 2023-03-13: qty 20

## 2023-03-13 MED ORDER — BUPIVACAINE HCL (PF) 0.5 % IJ SOLN
INTRAMUSCULAR | Status: DC | PRN
  Administered 2023-03-13: 10 mL

## 2023-03-13 MED ORDER — FENTANYL CITRATE (PF) 100 MCG/2ML IJ SOLN
INTRAMUSCULAR | Status: AC
  Filled 2023-03-13: qty 2

## 2023-03-13 MED ORDER — 0.9 % SODIUM CHLORIDE (POUR BTL) OPTIME
TOPICAL | Status: DC | PRN
  Administered 2023-03-13: 1000 mL

## 2023-03-13 MED ORDER — LACTATED RINGERS IV SOLN: INTRAVENOUS | Status: DC

## 2023-03-13 MED ORDER — KETOROLAC TROMETHAMINE 30 MG/ML IJ SOLN
INTRAMUSCULAR | Status: DC | PRN
  Administered 2023-03-13: 30 mg via INTRAVENOUS

## 2023-03-13 MED ORDER — ONDANSETRON HCL 4 MG PO TABS: 4.0000 mg | ORAL_TABLET | Freq: Three times a day (TID) | ORAL | 0 refills | Status: AC | PRN

## 2023-03-13 MED ORDER — BUPIVACAINE LIPOSOME 1.3 % IJ SUSP
INTRAMUSCULAR | Status: AC
Start: 2023-03-13 — End: ?
  Filled 2023-03-13: qty 10

## 2023-03-13 MED ORDER — ASPIRIN 81 MG PO TBEC: 81.0000 mg | DELAYED_RELEASE_TABLET | Freq: Two times a day (BID) | ORAL | 0 refills | Status: AC

## 2023-03-13 MED ORDER — FENTANYL CITRATE (PF) 100 MCG/2ML IJ SOLN
INTRAMUSCULAR | Status: DC | PRN
  Administered 2023-03-13 (×3): 25 ug via INTRAVENOUS

## 2023-03-13 MED ORDER — ONDANSETRON HCL 4 MG/2ML IJ SOLN
INTRAMUSCULAR | Status: DC | PRN
  Administered 2023-03-13: 4 mg via INTRAVENOUS

## 2023-03-13 MED ORDER — MIDAZOLAM HCL 2 MG/2ML IJ SOLN
INTRAMUSCULAR | Status: AC
  Filled 2023-03-13: qty 2

## 2023-03-13 MED ORDER — SODIUM CHLORIDE 0.9 % IV SOLN: INTRAVENOUS | Status: DC | PRN

## 2023-03-13 MED ORDER — CEFAZOLIN SODIUM-DEXTROSE 2-4 GM/100ML-% IV SOLN
2.0000 g | INTRAVENOUS | Status: AC
  Administered 2023-03-13: 2 g via INTRAVENOUS

## 2023-03-13 MED ORDER — LIDOCAINE HCL (PF) 2 % IJ SOLN
INTRAMUSCULAR | Status: AC
Start: 2023-03-13 — End: ?
  Filled 2023-03-13: qty 5

## 2023-03-13 MED ORDER — PROPOFOL 10 MG/ML IV BOLUS
INTRAVENOUS | Status: AC
  Filled 2023-03-13: qty 20

## 2023-03-13 MED ORDER — DEXAMETHASONE SODIUM PHOSPHATE 10 MG/ML IJ SOLN
INTRAMUSCULAR | Status: DC | PRN
  Administered 2023-03-13: 10 mg via INTRAVENOUS

## 2023-03-13 MED ORDER — PROPOFOL 10 MG/ML IV BOLUS
INTRAVENOUS | Status: DC | PRN
  Administered 2023-03-13: 200 mg via INTRAVENOUS

## 2023-03-13 MED ORDER — CEFAZOLIN SODIUM-DEXTROSE 2-3 GM-%(50ML) IV SOLR
INTRAVENOUS | Status: AC
Start: 2023-03-13 — End: ?
  Filled 2023-03-13: qty 50

## 2023-03-13 MED ORDER — LIDOCAINE HCL (CARDIAC) PF 100 MG/5ML IV SOSY
PREFILLED_SYRINGE | INTRAVENOUS | Status: DC | PRN
  Administered 2023-03-13: 60 mg via INTRAVENOUS

## 2023-03-13 SURGICAL SUPPLY — 32 items
BNDG COHESIVE 4X5 TAN STRL LF (GAUZE/BANDAGES/DRESSINGS) ×1 IMPLANT
BNDG ELASTIC 4X5.8 VLCR NS LF (GAUZE/BANDAGES/DRESSINGS) ×2 IMPLANT
BNDG ESMARK 4X12 TAN STRL LF (GAUZE/BANDAGES/DRESSINGS) ×1 IMPLANT
BNDG GAUZE DERMACEA FLUFF 4 (GAUZE/BANDAGES/DRESSINGS) ×1 IMPLANT
BOOT STEPPER DURA MED (SOFTGOODS) IMPLANT
CANISTER SUCT 1200ML W/VALVE (MISCELLANEOUS) ×1 IMPLANT
COVER LIGHT HANDLE UNIVERSAL (MISCELLANEOUS) ×2 IMPLANT
CUFF TOURN SGL QUICK 18X4 (TOURNIQUET CUFF) IMPLANT
DURAPREP 26ML APPLICATOR (WOUND CARE) ×1 IMPLANT
ELECT REM PT RETURN 9FT ADLT (ELECTROSURGICAL) ×1
ELECTRODE REM PT RTRN 9FT ADLT (ELECTROSURGICAL) ×1 IMPLANT
GAUZE SPONGE 4X4 12PLY STRL (GAUZE/BANDAGES/DRESSINGS) ×1 IMPLANT
GAUZE XEROFORM 1X8 LF (GAUZE/BANDAGES/DRESSINGS) ×1 IMPLANT
GLOVE BIOGEL PI IND STRL 7.5 (GLOVE) ×1 IMPLANT
GLOVE SURG SS PI 7.0 STRL IVOR (GLOVE) ×1 IMPLANT
GOWN STRL REUS W/ TWL LRG LVL3 (GOWN DISPOSABLE) ×2 IMPLANT
IV NS 250ML BAXH (IV SOLUTION) ×1 IMPLANT
IV NS 500ML BAXH (IV SOLUTION) IMPLANT
KIT ESCP INSRT D SLOT CANN KN (MISCELLANEOUS) ×1 IMPLANT
KIT PRC PRB RTRGD 3ANG KNF HND (MISCELLANEOUS) ×1 IMPLANT
KIT TURNOVER KIT A (KITS) ×1 IMPLANT
NS IRRIG 500ML POUR BTL (IV SOLUTION) ×1 IMPLANT
PACK EXTREMITY ARMC (MISCELLANEOUS) ×1 IMPLANT
PADDING CAST BLEND 4X4 NS (MISCELLANEOUS) ×3 IMPLANT
RASP SM TEAR CROSS CUT (RASP) IMPLANT
SPLINT CAST 1 STEP 4X30 (MISCELLANEOUS) ×1 IMPLANT
STOCKINETTE IMPERVIOUS LG (DRAPES) ×1 IMPLANT
SUT ETHILON 3-0 (SUTURE) ×1 IMPLANT
SUT ETHILON 3-0 FS-10 30 BLK (SUTURE) ×1
SUTURE EHLN 3-0 FS-10 30 BLK (SUTURE) IMPLANT
WAND TENDON TOPAZ 0 ANGL (MISCELLANEOUS) IMPLANT
WAND TOPAZ MICRO DEBRIDER (MISCELLANEOUS) IMPLANT

## 2023-03-13 NOTE — Anesthesia Postprocedure Evaluation (Signed)
Anesthesia Post Note  Patient: Labresha Boquist [place]  Procedure(s) Performed: PLANTAR FASCIA RELEASE (Left) [phone] - PARTIAL CALCANECTOMY (Left)  Patient location during evaluation: PACU Anesthesia Type: General Level of consciousness: awake and alert Pain management: pain level controlled Vital Signs Assessment: post-procedure vital signs reviewed and stable Respiratory status: spontaneous breathing, nonlabored ventilation, respiratory function stable and patient connected to nasal cannula oxygen Cardiovascular status: blood pressure returned to baseline and stable Postop Assessment: no apparent nausea or vomiting Anesthetic complications: no   No notable events documented.   Last Vitals:  Vitals:   03/13/23 0825 03/13/23 0830  BP:  103/68  Pulse: 69 71  Resp: 16 18  Temp:  (!) 36.2 C  SpO2: 99% 97%    Last Pain:  Vitals:   03/13/23 0830  TempSrc:   PainSc: 0-No pain                 Corinda Gubler

## 2023-03-13 NOTE — Anesthesia Procedure Notes (Signed)
Procedure Name: LMA Insertion Date/Time: 03/13/2023 7:29 AM  Performed by: Lanell Matar, CRNAPre-anesthesia Checklist: Patient identified, Emergency Drugs available, Suction available and Patient being monitored Patient Re-evaluated:Patient Re-evaluated prior to induction Oxygen Delivery Method: Circle System Utilized Preoxygenation: Pre-oxygenation with 100% oxygen Induction Type: IV induction Ventilation: Mask ventilation without difficulty LMA: LMA inserted LMA Size: 4.0 Number of attempts: 1 Airway Equipment and Method: Bite block Placement Confirmation: positive ETCO2 Tube secured with: Tape Dental Injury: Teeth and Oropharynx as per pre-operative assessment

## 2023-03-13 NOTE — Transfer of Care (Signed)
Immediate Anesthesia Transfer of Care Note  Patient: Catherine Adams  Procedure(s) Performed: PLANTAR FASCIA RELEASE (Left) 28210 - PARTIAL CALCANECTOMY (Left)  Patient Location: PACU  Anesthesia Type:General  Level of Consciousness: awake, drowsy, and patient cooperative  Airway & Oxygen Therapy: Patient Spontanous Breathing and Patient connected to nasal cannula oxygen  Post-op Assessment: Report given to RN, Post -op Vital signs reviewed and stable, and Patient moving all extremities X 4  Post vital signs: Reviewed and stable  Last Vitals:  Vitals Value Taken Time  BP 105/71 03/13/23 0814  Temp 36.2 C 03/13/23 0813  Pulse 84 03/13/23 0815  Resp 12 03/13/23 0815  SpO2 97 % 03/13/23 0815  Vitals shown include unfiled device data.  Last Pain:  Vitals:   03/13/23 0813  TempSrc:   PainSc: Asleep         Complications: No notable events documented.

## 2023-03-13 NOTE — Anesthesia Preprocedure Evaluation (Signed)
Anesthesia Evaluation  Patient identified by MRN, date of birth, ID band Patient awake    Reviewed: Allergy & Precautions, NPO status , Patient's Chart, lab work & pertinent test results  History of Anesthesia Complications (+) PONV and history of anesthetic complications  Airway Mallampati: II  TM Distance: >3 FB Neck ROM: Full    Dental no notable dental hx. (+) Teeth Intact   Pulmonary neg pulmonary ROS, neg sleep apnea, neg COPD, Patient abstained from smoking.Not current smoker, former smoker   Pulmonary exam normal breath sounds clear to auscultation       Cardiovascular Exercise Tolerance: Good METS(-) hypertension(-) CAD and (-) Past MI negative cardio ROS (-) dysrhythmias  Rhythm:Regular Rate:Normal - Systolic murmurs    Neuro/Psych  PSYCHIATRIC DISORDERS Anxiety     negative neurological ROS     GI/Hepatic ,GERD  Controlled,,(+)     (-) substance abuse    Endo/Other  neg diabetes  Takes GLP1 agonist, last taken 3 weeks ago. Denies GI symptoms today  Renal/GU negative Renal ROS     Musculoskeletal   Abdominal   Peds  Hematology   Anesthesia Other Findings Past Medical History: No date: Anxiety No date: Family history of adverse reaction to anesthesia     Comment:  Mother- slow to wake after dental procedure No date: PONV (postoperative nausea and vomiting)     Comment:  after foot surgery  Reproductive/Obstetrics                             Anesthesia Physical Anesthesia Plan  ASA: 2  Anesthesia Plan: General   Post-op Pain Management: Tylenol PO (post-op) and Fentanyl IV   Induction: Intravenous  PONV Risk Score and Plan: 4 or greater and Ondansetron, Dexamethasone and Midazolam  Airway Management Planned: LMA  Additional Equipment: None  Intra-op Plan:   Post-operative Plan: Extubation in OR  Informed Consent: I have reviewed the patients History and  Physical, chart, labs and discussed the procedure including the risks, benefits and alternatives for the proposed anesthesia with the patient or authorized representative who has indicated his/her understanding and acceptance.     Dental advisory given  Plan Discussed with: CRNA and Surgeon  Anesthesia Plan Comments: (Discussed risks of anesthesia with patient, including PONV, sore throat, lip/dental/eye damage. Rare risks discussed as well, such as cardiorespiratory and neurological sequelae, and allergic reactions. Discussed the role of CRNA in patient's perioperative care. Patient understands.)       Anesthesia Quick Evaluation

## 2023-03-13 NOTE — H&P (Signed)
HISTORY AND PHYSICAL INTERVAL NOTE:  03/13/2023  7:11 AM  Catherine Adams  has presented today for surgery, with the diagnosis of M72.2 - Plantar fasciitis of left foot M77.32 - Calcaneal spur, left M21.6X1, M21.6X2 - Equinus deformity of both feet M79.672 - Left foot pain.  The various methods of treatment have been discussed with the patient.  No guarantees were given.  After consideration of risks, benefits and other options for treatment, the patient has consented to surgery.  I have reviewed the patients' chart and labs.    PROCEDURE: LEFT ENDOSCOPIC PLANTAR FASCIIOTOMY TOPAZ DEBRIDEMENT OF LEFT PLANTAR FASCIA LEFT PLANTAR HEEL SPUR RESECTION USE OF INTRA-OP FLUOROSCOPY  A history and physical examination was performed in my office.  The patient was reexamined.  There have been no changes to this history and physical examination.  Rosetta Posner, DPM

## 2023-03-13 NOTE — Op Note (Addendum)
PODIATRY / FOOT AND ANKLE SURGERY OPERATIVE REPORT    SURGEON: Rosetta Posner, DPM  PRE-OPERATIVE DIAGNOSIS:  1.  Left plantar fasciitis with calcaneal spur  POST-OPERATIVE DIAGNOSIS: Same  PROCEDURE(S): Left endoscopic plantar fasciotomy with Topaz micro debridement Resection of left calcaneal heel spur Use of intraoperative fluoroscopy  HEMOSTASIS: Left ankle tourniquet  ANESTHESIA: general  ESTIMATED BLOOD LOSS: 2 cc  FINDING(S): 1.  Thickened left plantar fascia with calcaneal heel spur  PATHOLOGY/SPECIMEN(S): None  INDICATIONS:   Catherine Adams is a 48 y.o. female who presents with chronic left plantar heel pain.  Patient has exhausted conservative measures and presents today for surgical intervention.  She did have an MRI which showed thickening of the plantar fascia with a large plantar calcaneal heel spur with some mild calcaneal marrow edema.  All treatment options were discussed with the patient both conservative and surgical attempts at correction include potential risks and complications at this time patient is elected for surgical intervention described above and below.  Consent obtained.  No guarantees given.  DESCRIPTION: After obtaining full informed written consent, the patient was brought back to the operating room and placed supine upon the operating table.  The patient received IV antibiotics prior to induction.  After obtaining adequate anesthesia, the patient was prepped and draped in the standard fashion.  15 cc of half percent Marcaine plain was injected about the left heel and ankle to block the area.  An Esmarch bandage used to exsanguinate the left lower extremity the pneumatic ankle tourniquet was inflated.  Attention was directed to the medial heel slightly distal to the origin of the plantar fascia on the calcaneus.  The incision was made over this area parallel to the longitudinal axis of the foot at the medial heel in that area, approximately 1 cm.  The  tissues are deepened to the subcutaneous tissues with blunt dissection going underneath the plantar fascia going to the lateral aspect of the foot.  Once the lateral aspect of the foot was able to be visualized with the instrument going from medial to lateral a small percutaneous incision was made in this area on the lateral heel.  The obturator and cannula was then inserted from medial to lateral.  The obturator was removed and the cannula was left intact with the appropriate orientation.  The scope was then placed through the lateral heel portal and the triangular hook blade was placed at the medial heel portal.  Approximately two thirds of the central band and all of the medial band of the plantar fascia was released at the operative site under endoscopic guidance.  After the release was performed the flexor digitorum brevis muscle belly was identified indicating successful release of the plantar fascia.  Intraoperative imaging was taken with the arthroscopy set.  A surgical flush was performed with copious amounts normal sterile saline.  At this time the C-arm was utilized to take an image of the lateral heel showing the small heel spur present.  Through the medial heel portal that was made the bone rasp was placed onto the surface of the calcaneus with heel spur and under intraoperative fluoroscopy guidance the heel spur was resected.  A flush was performed with copious amounts normal sterile saline.  Final C-arm imaging was taken showing reduction of the heel spur with near-anatomic contour.  Attention was then directed to the plantar heel where a 5 x 4 grid was made at the plantar medial heel and along the course of the central band  of the plantar fascia where the release was performed.  At this time a 0.062 K wire was used to penetrate these holes.  The Topaz micro debridement wand was then placed into each of the holes and debrided in 3 different locations for each hole.  The instrumentation was  removed.  The medial and lateral heel incisions were then reapproximated well coapted with 3-0 nylon.  10 cc of Exparel was then injected about the operative areas.  A postoperative dressing was then applied consisting of Xeroform to the operative sites followed by 4 x 4 gauze, gauze roll, Ace wrap, tall cam boot.  The pneumatic ankle tourniquet was deflated prior to placing the cam boot and a prompt hyperemic response was noted all digits left foot.  The patient tolerated the procedure and anesthesia well.  Following a period of postoperative monitoring the patient be discharged home with the appropriate orders, instructions, and medications.  Patient to follow-up in 1 week for reevaluation and dressing change.  Patient to remain nonweightbearing at all times left lower extremity for the next 2 weeks.  COMPLICATIONS: none  CONDITION: good, stable  Rosetta Posner, DPM

## 2023-03-14 ENCOUNTER — Encounter: Payer: Self-pay | Admitting: Podiatry

## 2023-07-25 ENCOUNTER — Other Ambulatory Visit (HOSPITAL_COMMUNITY): Payer: Self-pay | Admitting: Family Medicine

## 2023-07-25 DIAGNOSIS — M545 Low back pain, unspecified: Secondary | ICD-10-CM

## 2023-08-01 ENCOUNTER — Ambulatory Visit (HOSPITAL_COMMUNITY)
Admission: RE | Admit: 2023-08-01 | Discharge: 2023-08-01 | Disposition: A | Discharge status: Home / Self Care | Source: Ambulatory Visit | Attending: Family Medicine | Admitting: Family Medicine

## 2023-08-01 DIAGNOSIS — M545 Low back pain, unspecified: Secondary | ICD-10-CM | POA: Diagnosis present

## 2023-10-16 LAB — COLOGUARD: COLOGUARD: NEGATIVE

## 2023-11-03 ENCOUNTER — Telehealth: Payer: Self-pay | Admitting: Neurology

## 2023-11-03 NOTE — Telephone Encounter (Signed)
 Request made  to cx appointment

## 2023-11-04 ENCOUNTER — Ambulatory Visit: Admitting: Neurology

## 2023-12-10 ENCOUNTER — Ambulatory Visit
Admission: RE | Admit: 2023-12-10 | Discharge: 2023-12-10 | Disposition: A | Discharge status: Home / Self Care | Payer: 59 | Source: Ambulatory Visit | Attending: Obstetrics and Gynecology | Admitting: Obstetrics and Gynecology

## 2023-12-10 DIAGNOSIS — Z0000 Encounter for general adult medical examination without abnormal findings: Secondary | ICD-10-CM
